# Patient Record
Sex: Female | Born: 1985 | Race: White | Hispanic: No | Marital: Married | Smoking: Never smoker
Health system: Southern US, Community
[De-identification: ages and names within clinical notes are randomized; demographics above are authoritative.]

## PROBLEM LIST (undated history)

## (undated) ENCOUNTER — Ambulatory Visit: Discharge status: Absent without leave | Payer: BLUE CROSS/BLUE SHIELD

## (undated) DIAGNOSIS — F419 Anxiety disorder, unspecified: Secondary | ICD-10-CM

## (undated) DIAGNOSIS — T7840XA Allergy, unspecified, initial encounter: Secondary | ICD-10-CM

## (undated) HISTORY — DX: Anxiety disorder, unspecified: F41.9

## (undated) HISTORY — PX: NO PAST SURGERIES: SHX2092

## (undated) HISTORY — DX: Allergy, unspecified, initial encounter: T78.40XA

---

## 2009-03-06 ENCOUNTER — Emergency Department (HOSPITAL_COMMUNITY): Admission: EM | Admit: 2009-03-06 | Discharge: 2009-03-06 | Payer: Self-pay | Admitting: Emergency Medicine

## 2013-02-16 ENCOUNTER — Other Ambulatory Visit (HOSPITAL_COMMUNITY): Payer: Self-pay | Admitting: Obstetrics and Gynecology

## 2013-02-16 DIAGNOSIS — IMO0002 Reserved for concepts with insufficient information to code with codable children: Secondary | ICD-10-CM

## 2013-02-23 ENCOUNTER — Ambulatory Visit (HOSPITAL_COMMUNITY)
Admission: RE | Admit: 2013-02-23 | Discharge: 2013-02-23 | Disposition: A | Discharge status: Home / Self Care | Payer: BC Managed Care – PPO | Source: Ambulatory Visit | Attending: Obstetrics and Gynecology | Admitting: Obstetrics and Gynecology

## 2013-02-23 DIAGNOSIS — Q51810 Arcuate uterus: Secondary | ICD-10-CM | POA: Insufficient documentation

## 2013-02-23 DIAGNOSIS — IMO0002 Reserved for concepts with insufficient information to code with codable children: Secondary | ICD-10-CM

## 2013-02-23 DIAGNOSIS — N979 Female infertility, unspecified: Secondary | ICD-10-CM | POA: Insufficient documentation

## 2013-02-23 MED ORDER — IOHEXOL 300 MG/ML  SOLN
20.0000 mL | Freq: Once | INTRAMUSCULAR | Status: AC | PRN
  Administered 2013-02-23: 7 mL

## 2013-11-08 ENCOUNTER — Ambulatory Visit (INDEPENDENT_AMBULATORY_CARE_PROVIDER_SITE_OTHER): Payer: BC Managed Care – PPO | Admitting: Physician Assistant

## 2013-11-08 ENCOUNTER — Encounter: Payer: Self-pay | Admitting: Physician Assistant

## 2013-11-08 VITALS — BP 126/76 | HR 106 | Temp 99.8°F | Resp 16 | Ht 70.75 in | Wt 227.0 lb

## 2013-11-08 DIAGNOSIS — R05 Cough: Secondary | ICD-10-CM

## 2013-11-08 DIAGNOSIS — F411 Generalized anxiety disorder: Secondary | ICD-10-CM | POA: Insufficient documentation

## 2013-11-08 DIAGNOSIS — R059 Cough, unspecified: Secondary | ICD-10-CM

## 2013-11-08 DIAGNOSIS — J302 Other seasonal allergic rhinitis: Secondary | ICD-10-CM | POA: Insufficient documentation

## 2013-11-08 MED ORDER — AZITHROMYCIN 250 MG PO TABS: ORAL_TABLET | ORAL | Status: DC

## 2013-11-08 MED ORDER — HYDROCOD POLST-CHLORPHEN POLST 10-8 MG/5ML PO LQCR
5.0000 mL | Freq: Two times a day (BID) | ORAL | Status: DC | PRN
Start: 2013-11-08 — End: 2015-05-30

## 2013-11-08 MED ORDER — BENZONATATE 100 MG PO CAPS: 100.0000 mg | ORAL_CAPSULE | Freq: Three times a day (TID) | ORAL | Status: DC | PRN

## 2013-11-08 NOTE — Progress Notes (Signed)
I have discussed this case with Ms. Brewington, PA-C and agree.  

## 2013-11-08 NOTE — Progress Notes (Signed)
   Subjective:    Patient ID: Sherry [name], female    DOB: 12/15/1985, 28 y.o.   MRN: [id number]  HPI Patient presents with 4 days of productive cough. Denies upper resp. sx., but endorses fever of 99.5 at home, fatigue, CP and vomiting secondary to cough. Coughs throughout day, but worse at night. 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 Has taken Delsym cough and cold dm with minimal relief. Has controlled seasonal allergies, but no h/o asthma.   Review of Systems  Constitutional: Positive for fever and fatigue. Negative for chills and appetite change.  HENT: Positive for sore throat (secondary to cough). Negative for congestion, ear discharge, ear pain, postnasal drip, rhinorrhea, sinus pressure and sneezing.   Eyes: Negative for pain.  Respiratory: Positive for cough (productive). Negative for chest tightness, shortness of breath and wheezing.   Cardiovascular: Positive for chest pain (secondary to cough).  Gastrointestinal: Positive for vomiting (secondary to cough). Negative for nausea, abdominal pain, diarrhea and constipation.  Musculoskeletal: Negative for myalgias, neck pain and neck stiffness.  Skin: Negative for pallor and rash.  Allergic/Immunologic: Negative for environmental allergies and food allergies.  Neurological: Negative for dizziness, light-headedness and headaches.  Hematological: Negative for adenopathy.       Objective:   Physical Exam  Constitutional: She is oriented to person, place, and time. She appears well-developed and well-nourished. No distress.  Blood pressure 126/76, pulse 106, temperature 99.8 F (37.7 C), resp. rate 16.   HENT:  Head: Normocephalic and atraumatic.  Right Ear: External ear normal.  Left Ear: External ear normal.  Mouth/Throat: Oropharynx is clear and moist. No  oropharyngeal exudate.  Eyes: Conjunctivae are normal. Pupils are equal, round, and reactive to light. Right eye exhibits no discharge. Left eye exhibits no discharge.  Neck: Normal range of motion. Neck supple.  Cardiovascular: Regular rhythm and normal heart sounds.  Tachycardia present.  Exam reveals no gallop and no friction rub.   No murmur heard. Pulmonary/Chest: Effort normal. No accessory muscle usage. No tachypnea. No respiratory distress. She has no wheezes. She has no rhonchi. She has no rales. She exhibits tenderness.  Breath sounds normal until patient coughs sounds wet   Abdominal: Soft. She exhibits no mass. There is no tenderness.  Lymphadenopathy:    She has no cervical adenopathy.  Neurological: She is alert and oriented to person, place, and time.  Skin: Skin is warm and dry. No rash noted. She is not diaphoretic. No erythema. No pallor.        Assessment & Plan:  1. Cough With hosptialized sick contact and fever, will treat more aggressively. - benzonatate (TESSALON) 100 MG capsule; Take 1-2 capsules (100-200 mg total) by mouth 3 (three) times daily as needed for cough.  Dispense: 40 capsule; Refill: 0 - chlorpheniramine-HYDROcodone (TUSSIONEX PENNKINETIC ER) 10-8 MG/5ML LQCR; Take 5 mLs by mouth every 12 (twelve) hours as needed for cough (cough).  Dispense: 60 mL; Refill: 0 - azithromycin (ZITHROMAX) 250 MG tablet; Take 2 tabs PO x 1 dose, then 1 tab PO QD x 4 days  Dispense: 6 tablet; Refill: 0 - Increase fluid intake and get plenty of rest. - Ibuprofen for fever. - RTC if fever increases to 101 degrees.  Janan Ridgeishira Edahi Kroening PA-C  [facility] 11/08/2013 1:08 PM

## 2014-04-09 ENCOUNTER — Other Ambulatory Visit: Payer: Self-pay | Admitting: Obstetrics & Gynecology

## 2014-04-19 ENCOUNTER — Encounter (HOSPITAL_COMMUNITY): Payer: Self-pay

## 2014-04-19 ENCOUNTER — Encounter (HOSPITAL_COMMUNITY)
Admission: RE | Admit: 2014-04-19 | Discharge: 2014-04-19 | Disposition: A | Discharge status: Home / Self Care | Payer: BLUE CROSS/BLUE SHIELD | Source: Ambulatory Visit | Attending: Obstetrics & Gynecology | Admitting: Obstetrics & Gynecology

## 2014-04-19 DIAGNOSIS — N8329 Other ovarian cysts: Secondary | ICD-10-CM | POA: Diagnosis not present

## 2014-04-19 DIAGNOSIS — N971 Female infertility of tubal origin: Secondary | ICD-10-CM | POA: Diagnosis not present

## 2014-04-19 DIAGNOSIS — F419 Anxiety disorder, unspecified: Secondary | ICD-10-CM | POA: Diagnosis not present

## 2014-04-19 LAB — CBC
HEMATOCRIT: 42.2 % (ref 36.0–46.0)
Hemoglobin: 14.1 g/dL (ref 12.0–15.0)
MCH: 29.6 pg (ref 26.0–34.0)
MCHC: 33.4 g/dL (ref 30.0–36.0)
MCV: 88.7 fL (ref 78.0–100.0)
Platelets: 195 10*3/uL (ref 150–400)
RBC: 4.76 MIL/uL (ref 3.87–5.11)
RDW: 13.7 % (ref 11.5–15.5)
WBC: 6.8 10*3/uL (ref 4.0–10.5)

## 2014-04-19 LAB — TYPE AND SCREEN
ABO/RH(D): O POS
ANTIBODY SCREEN: NEGATIVE

## 2014-04-19 LAB — ABO/RH: ABO/RH(D): O POS

## 2014-04-19 NOTE — Patient Instructions (Signed)
Your procedure is scheduled on:04/22/14  Enter through the Main Entrance at :6am  Pick up desk phone and dial [phone] and inform us of your arrival.  Please call [phone] if you have any problems the morning of surgery.  Remember: Do not eat food or drink liquids, including water, after midnight WED   You may brush your teeth the morning of surgery.    DO NOT wear jewelry, eye make-up, lipstick,body lotion, or dark fingernail polish.  (Polished toes are ok) You may wear deodorant.  If you are to be admitted after surgery, leave suitcase in car until your room has been assigned. Patients discharged on the day of surgery will not be allowed to drive home. Wear loose fitting, comfortable clothes for your ride home.

## 2014-04-21 ENCOUNTER — Encounter (HOSPITAL_COMMUNITY): Payer: Self-pay | Admitting: Anesthesiology

## 2014-04-21 NOTE — Anesthesia Preprocedure Evaluation (Addendum)
Anesthesia Evaluation  Patient identified by MRN, date of birth, ID band Patient awake    Reviewed: Allergy & Precautions, NPO status , Patient's Chart, lab work & pertinent test results  Airway Mallampati: I  TM Distance: >3 FB Neck ROM: Full    Dental no notable dental hx. (+) Teeth Intact   Pulmonary neg pulmonary ROS,  breath sounds clear to auscultation  Pulmonary exam normal       Cardiovascular negative cardio ROS  Rhythm:Regular Rate:Normal     Neuro/Psych Anxiety negative neurological ROS     GI/Hepatic negative GI ROS, Neg liver ROS,   Endo/Other  Complex left ovarian cyst  Renal/GU negative Renal ROS  negative genitourinary   Musculoskeletal negative musculoskeletal ROS (+)   Abdominal   Peds  Hematology negative hematology ROS (+)   Anesthesia Other Findings   Reproductive/Obstetrics negative OB ROS                            Anesthesia Physical Anesthesia Plan  ASA: II  Anesthesia Plan: General   Post-op Pain Management:    Induction: Intravenous  Airway Management Planned: Oral ETT  Additional Equipment:   Intra-op Plan:   Post-operative Plan: Extubation in OR  Informed Consent: I have reviewed the patients History and Physical, chart, labs and discussed the procedure including the risks, benefits and alternatives for the proposed anesthesia with the patient or authorized representative who has indicated his/her understanding and acceptance.   Dental advisory given  Plan Discussed with: CRNA, Anesthesiologist and Surgeon  Anesthesia Plan Comments:         Anesthesia Quick Evaluation

## 2014-04-22 ENCOUNTER — Ambulatory Visit (HOSPITAL_COMMUNITY): Payer: BLUE CROSS/BLUE SHIELD | Admitting: Anesthesiology

## 2014-04-22 ENCOUNTER — Encounter (HOSPITAL_COMMUNITY)
Admission: RE | Disposition: A | Discharge status: Home / Self Care | Payer: Self-pay | Source: Ambulatory Visit | Attending: Obstetrics & Gynecology

## 2014-04-22 ENCOUNTER — Ambulatory Visit (HOSPITAL_COMMUNITY)
Admission: RE | Admit: 2014-04-22 | Discharge: 2014-04-22 | Disposition: A | Discharge status: Home / Self Care | Payer: BLUE CROSS/BLUE SHIELD | Source: Ambulatory Visit | Attending: Obstetrics & Gynecology | Admitting: Obstetrics & Gynecology

## 2014-04-22 DIAGNOSIS — N971 Female infertility of tubal origin: Secondary | ICD-10-CM | POA: Insufficient documentation

## 2014-04-22 DIAGNOSIS — N8329 Other ovarian cysts: Secondary | ICD-10-CM | POA: Diagnosis not present

## 2014-04-22 DIAGNOSIS — F419 Anxiety disorder, unspecified: Secondary | ICD-10-CM | POA: Insufficient documentation

## 2014-04-22 HISTORY — PX: ROBOTIC ASSISTED LAPAROSCOPIC OVARIAN CYSTECTOMY: SHX6081

## 2014-04-22 HISTORY — PX: CHROMOPERTUBATION: SHX6288

## 2014-04-22 LAB — PREGNANCY, URINE: Preg Test, Ur: NEGATIVE

## 2014-04-22 SURGERY — ROBOTIC ASSISTED LAPAROSCOPIC OVARIAN CYSTECTOMY
Anesthesia: General | Site: Uterus

## 2014-04-22 MED ORDER — MIDAZOLAM HCL 2 MG/2ML IJ SOLN
INTRAMUSCULAR | Status: AC
  Filled 2014-04-22: qty 2

## 2014-04-22 MED ORDER — ROCURONIUM BROMIDE 100 MG/10ML IV SOLN
INTRAVENOUS | Status: DC | PRN
  Administered 2014-04-22 (×2): 10 mg via INTRAVENOUS
  Administered 2014-04-22: 35 mg via INTRAVENOUS
  Administered 2014-04-22: 5 mg via INTRAVENOUS

## 2014-04-22 MED ORDER — ONDANSETRON HCL 4 MG/2ML IJ SOLN
INTRAMUSCULAR | Status: AC
  Filled 2014-04-22: qty 2

## 2014-04-22 MED ORDER — ONDANSETRON HCL 4 MG/2ML IJ SOLN
INTRAMUSCULAR | Status: DC | PRN
  Administered 2014-04-22: 4 mg via INTRAVENOUS

## 2014-04-22 MED ORDER — BUPIVACAINE HCL (PF) 0.25 % IJ SOLN
INTRAMUSCULAR | Status: AC
  Filled 2014-04-22: qty 30

## 2014-04-22 MED ORDER — GLYCOPYRROLATE 0.2 MG/ML IJ SOLN
INTRAMUSCULAR | Status: DC | PRN
  Administered 2014-04-22: .9 mg via INTRAVENOUS
  Administered 2014-04-22: 0.1 mg via INTRAVENOUS

## 2014-04-22 MED ORDER — LIDOCAINE HCL (CARDIAC) 20 MG/ML IV SOLN
INTRAVENOUS | Status: DC | PRN
Start: 2014-04-22 — End: 2014-04-22
  Administered 2014-04-22: 100 mg via INTRAVENOUS

## 2014-04-22 MED ORDER — LIDOCAINE HCL (CARDIAC) 20 MG/ML IV SOLN
INTRAVENOUS | Status: AC
  Filled 2014-04-22: qty 5

## 2014-04-22 MED ORDER — DEXAMETHASONE SODIUM PHOSPHATE 4 MG/ML IJ SOLN
INTRAMUSCULAR | Status: DC | PRN
  Administered 2014-04-22: 4 mg via INTRAVENOUS

## 2014-04-22 MED ORDER — OXYCODONE-ACETAMINOPHEN 7.5-325 MG PO TABS: 1.0000 | ORAL_TABLET | ORAL | Status: DC | PRN

## 2014-04-22 MED ORDER — METHYLENE BLUE 1 % INJ SOLN
INTRAMUSCULAR | Status: AC
  Filled 2014-04-22: qty 1

## 2014-04-22 MED ORDER — NEOSTIGMINE METHYLSULFATE 10 MG/10ML IV SOLN
INTRAVENOUS | Status: DC | PRN
  Administered 2014-04-22: 5 mg via INTRAVENOUS

## 2014-04-22 MED ORDER — SCOPOLAMINE 1 MG/3DAYS TD PT72
MEDICATED_PATCH | TRANSDERMAL | Status: AC
  Administered 2014-04-22: 1.5 mg via TRANSDERMAL
  Filled 2014-04-22: qty 1

## 2014-04-22 MED ORDER — BUPIVACAINE HCL (PF) 0.25 % IJ SOLN
INTRAMUSCULAR | Status: DC | PRN
  Administered 2014-04-22: 20 mL

## 2014-04-22 MED ORDER — HEPARIN SODIUM (PORCINE) 5000 UNIT/ML IJ SOLN
INTRAMUSCULAR | Status: AC
  Filled 2014-04-22: qty 1

## 2014-04-22 MED ORDER — LACTATED RINGERS IV SOLN
INTRAVENOUS | Status: DC
  Administered 2014-04-22 (×2): via INTRAVENOUS

## 2014-04-22 MED ORDER — FENTANYL CITRATE 0.05 MG/ML IJ SOLN
25.0000 ug | INTRAMUSCULAR | Status: DC | PRN
  Administered 2014-04-22: 50 ug via INTRAVENOUS

## 2014-04-22 MED ORDER — DEXAMETHASONE SODIUM PHOSPHATE 10 MG/ML IJ SOLN
INTRAMUSCULAR | Status: AC
  Filled 2014-04-22: qty 1

## 2014-04-22 MED ORDER — METHYLENE BLUE 1 % INJ SOLN
INTRAMUSCULAR | Status: DC | PRN
  Administered 2014-04-22: 1 mL

## 2014-04-22 MED ORDER — FENTANYL CITRATE 0.05 MG/ML IJ SOLN
INTRAMUSCULAR | Status: AC
  Filled 2014-04-22: qty 2

## 2014-04-22 MED ORDER — GLYCOPYRROLATE 0.2 MG/ML IJ SOLN
INTRAMUSCULAR | Status: AC
  Filled 2014-04-22: qty 1

## 2014-04-22 MED ORDER — NEOSTIGMINE METHYLSULFATE 10 MG/10ML IV SOLN
INTRAVENOUS | Status: AC
  Filled 2014-04-22: qty 1

## 2014-04-22 MED ORDER — VASOPRESSIN 20 UNIT/ML IV SOLN
INTRAVENOUS | Status: AC
  Filled 2014-04-22: qty 1

## 2014-04-22 MED ORDER — PROPOFOL 10 MG/ML IV BOLUS
INTRAVENOUS | Status: AC
  Filled 2014-04-22: qty 20

## 2014-04-22 MED ORDER — ROPIVACAINE HCL 5 MG/ML IJ SOLN
INTRAMUSCULAR | Status: AC
  Filled 2014-04-22: qty 60

## 2014-04-22 MED ORDER — LACTATED RINGERS IR SOLN
Status: DC | PRN
  Administered 2014-04-22: 500 mL

## 2014-04-22 MED ORDER — STERILE WATER FOR IRRIGATION IR SOLN
Status: DC | PRN
  Administered 2014-04-22: 3000 mL

## 2014-04-22 MED ORDER — KETOROLAC TROMETHAMINE 30 MG/ML IJ SOLN
INTRAMUSCULAR | Status: DC | PRN
  Administered 2014-04-22: 30 mg via INTRAVENOUS

## 2014-04-22 MED ORDER — CEFAZOLIN SODIUM-DEXTROSE 2-3 GM-% IV SOLR
INTRAVENOUS | Status: AC
  Filled 2014-04-22: qty 50

## 2014-04-22 MED ORDER — HYDROMORPHONE HCL 1 MG/ML IJ SOLN
INTRAMUSCULAR | Status: DC | PRN
Start: 2014-04-22 — End: 2014-04-22
  Administered 2014-04-22: 1 mg via INTRAVENOUS

## 2014-04-22 MED ORDER — MIDAZOLAM HCL 2 MG/2ML IJ SOLN
INTRAMUSCULAR | Status: DC | PRN
  Administered 2014-04-22: 2 mg via INTRAVENOUS

## 2014-04-22 MED ORDER — SCOPOLAMINE 1 MG/3DAYS TD PT72
1.0000 | MEDICATED_PATCH | Freq: Once | TRANSDERMAL | Status: DC
  Administered 2014-04-22: 1.5 mg via TRANSDERMAL

## 2014-04-22 MED ORDER — MEPERIDINE HCL 25 MG/ML IJ SOLN: 6.2500 mg | INTRAMUSCULAR | Status: DC | PRN

## 2014-04-22 MED ORDER — FENTANYL CITRATE 0.05 MG/ML IJ SOLN
INTRAMUSCULAR | Status: AC
  Filled 2014-04-22: qty 5

## 2014-04-22 MED ORDER — PROPOFOL 10 MG/ML IV BOLUS
INTRAVENOUS | Status: DC | PRN
  Administered 2014-04-22: 200 mg via INTRAVENOUS

## 2014-04-22 MED ORDER — AMMONIA AROMATIC IN INHA
RESPIRATORY_TRACT | Status: AC
  Filled 2014-04-22: qty 10

## 2014-04-22 MED ORDER — ARTIFICIAL TEARS OP OINT
TOPICAL_OINTMENT | OPHTHALMIC | Status: DC | PRN
  Administered 2014-04-22: 1 via OPHTHALMIC

## 2014-04-22 MED ORDER — FENTANYL CITRATE 0.05 MG/ML IJ SOLN
INTRAMUSCULAR | Status: DC | PRN
  Administered 2014-04-22: 100 ug via INTRAVENOUS
  Administered 2014-04-22: 50 ug via INTRAVENOUS
  Administered 2014-04-22: 100 ug via INTRAVENOUS

## 2014-04-22 MED ORDER — DEXAMETHASONE SODIUM PHOSPHATE 4 MG/ML IJ SOLN
INTRAMUSCULAR | Status: AC
Start: 2014-04-22 — End: 2014-04-22
  Filled 2014-04-22: qty 1

## 2014-04-22 MED ORDER — SODIUM CHLORIDE 0.9 % IJ SOLN
INTRAMUSCULAR | Status: AC
  Filled 2014-04-22: qty 100

## 2014-04-22 MED ORDER — GLYCOPYRROLATE 0.2 MG/ML IJ SOLN
INTRAMUSCULAR | Status: AC
  Filled 2014-04-22: qty 4

## 2014-04-22 MED ORDER — CEFAZOLIN SODIUM-DEXTROSE 2-3 GM-% IV SOLR
2.0000 g | INTRAVENOUS | Status: AC
  Administered 2014-04-22: 2 g via INTRAVENOUS

## 2014-04-22 MED ORDER — METOCLOPRAMIDE HCL 5 MG/ML IJ SOLN: 10.0000 mg | Freq: Once | INTRAMUSCULAR | Status: DC | PRN

## 2014-04-22 MED ORDER — HYDROMORPHONE HCL 1 MG/ML IJ SOLN
INTRAMUSCULAR | Status: AC
  Filled 2014-04-22: qty 1

## 2014-04-22 SURGICAL SUPPLY — 62 items
BARRIER ADHS 3X4 INTERCEED (GAUZE/BANDAGES/DRESSINGS) IMPLANT
CHLORAPREP W/TINT 26ML (MISCELLANEOUS) ×4 IMPLANT
CLOTH BEACON ORANGE TIMEOUT ST (SAFETY) ×4 IMPLANT
CONT PATH 16OZ SNAP LID 3702 (MISCELLANEOUS) ×4 IMPLANT
COVER BACK TABLE 60X90IN (DRAPES) ×8 IMPLANT
COVER TIP SHEARS 8 DVNC (MISCELLANEOUS) ×2 IMPLANT
COVER TIP SHEARS 8MM DA VINCI (MISCELLANEOUS) ×2
DECANTER SPIKE VIAL GLASS SM (MISCELLANEOUS) ×8 IMPLANT
DRAPE WARM FLUID 44X44 (DRAPE) ×4 IMPLANT
DRSG COVADERM PLUS 2X2 (GAUZE/BANDAGES/DRESSINGS) IMPLANT
DRSG OPSITE POSTOP 3X4 (GAUZE/BANDAGES/DRESSINGS) ×4 IMPLANT
ELECT REM PT RETURN 9FT ADLT (ELECTROSURGICAL) ×4
ELECTRODE REM PT RTRN 9FT ADLT (ELECTROSURGICAL) ×2 IMPLANT
GAUZE VASELINE 3X9 (GAUZE/BANDAGES/DRESSINGS) ×4 IMPLANT
GLOVE BIO SURGEON STRL SZ 6.5 (GLOVE) ×15 IMPLANT
GLOVE BIO SURGEONS STRL SZ 6.5 (GLOVE) ×5
GLOVE BIOGEL PI IND STRL 6.5 (GLOVE) ×8 IMPLANT
GLOVE BIOGEL PI IND STRL 7.0 (GLOVE) ×16 IMPLANT
GLOVE BIOGEL PI INDICATOR 6.5 (GLOVE) ×8
GLOVE BIOGEL PI INDICATOR 7.0 (GLOVE) ×16
GLOVE ECLIPSE 7.0 STRL STRAW (GLOVE) ×12 IMPLANT
IV STOPCOCK 4 WAY 40  W/Y SET (IV SOLUTION) ×2
IV STOPCOCK 4 WAY 40 W/Y SET (IV SOLUTION) ×2 IMPLANT
KIT ACCESSORY DA VINCI DISP (KITS) ×2
KIT ACCESSORY DVNC DISP (KITS) ×2 IMPLANT
LIQUID BAND (GAUZE/BANDAGES/DRESSINGS) ×4 IMPLANT
NEEDLE HYPO 22GX1.5 SAFETY (NEEDLE) ×4 IMPLANT
OCCLUDER COLPOPNEUMO (BALLOONS) ×4 IMPLANT
PACK ROBOT WH (CUSTOM PROCEDURE TRAY) ×4 IMPLANT
PACK ROBOTIC GOWN (GOWN DISPOSABLE) ×4 IMPLANT
PAD POSITIONER PINK NONSTERILE (MISCELLANEOUS) ×4 IMPLANT
PAD PREP 24X48 CUFFED NSTRL (MISCELLANEOUS) ×8 IMPLANT
SET CYSTO W/LG BORE CLAMP LF (SET/KITS/TRAYS/PACK) IMPLANT
SET IRRIG TUBING LAPAROSCOPIC (IRRIGATION / IRRIGATOR) ×4 IMPLANT
SET TRI-LUMEN FLTR TB AIRSEAL (TUBING) ×4 IMPLANT
SUT VIC AB 0 CT1 27 (SUTURE) ×4
SUT VIC AB 0 CT1 27XBRD ANTBC (SUTURE) ×4 IMPLANT
SUT VIC AB 0 CT2 27 (SUTURE) IMPLANT
SUT VIC AB 2-0 CT1 27 (SUTURE)
SUT VIC AB 2-0 CT1 TAPERPNT 27 (SUTURE) IMPLANT
SUT VIC AB 3-0 SH 27 (SUTURE)
SUT VIC AB 3-0 SH 27X BRD (SUTURE) IMPLANT
SUT VIC AB 4-0 PS2 27 (SUTURE) ×8 IMPLANT
SUT VICRYL 0 UR6 27IN ABS (SUTURE) ×8 IMPLANT
SYR 50ML LL SCALE MARK (SYRINGE) ×4 IMPLANT
SYR TB 1ML 25GX5/8 (SYRINGE) ×4 IMPLANT
SYSTEM CONVERTIBLE TROCAR (TROCAR) ×4 IMPLANT
TIP UTERINE 5.1X6CM LAV DISP (MISCELLANEOUS) IMPLANT
TIP UTERINE 6.7X10CM GRN DISP (MISCELLANEOUS) IMPLANT
TIP UTERINE 6.7X6CM WHT DISP (MISCELLANEOUS) IMPLANT
TIP UTERINE 6.7X8CM BLUE DISP (MISCELLANEOUS) ×4 IMPLANT
TOWEL OR 17X24 6PK STRL BLUE (TOWEL DISPOSABLE) ×12 IMPLANT
TRAY FOLEY BAG SILVER LF 16FR (SET/KITS/TRAYS/PACK) ×4 IMPLANT
TROCAR 12M 150ML BLUNT (TROCAR) ×4 IMPLANT
TROCAR DISP BLADELESS 8 DVNC (TROCAR) ×2 IMPLANT
TROCAR DISP BLADELESS 8MM (TROCAR) ×2
TROCAR OPTI TIP 12M 100M (ENDOMECHANICALS) IMPLANT
TROCAR PORT AIRSEAL 5X120 (TROCAR) ×4 IMPLANT
TROCAR XCEL 12X100 BLDLESS (ENDOMECHANICALS) IMPLANT
TROCAR XCEL NON-BLD 5MMX100MML (ENDOMECHANICALS) IMPLANT
WARMER LAPAROSCOPE (MISCELLANEOUS) ×4 IMPLANT
WATER STERILE IRR 1000ML POUR (IV SOLUTION) ×12 IMPLANT

## 2014-04-22 NOTE — Anesthesia Procedure Notes (Signed)
Procedure Name: Intubation Date/Time: 04/22/2014 7:36 AM Performed by: Graciela HusbandsFUSSELL, Christoher Drudge O Pre-anesthesia Checklist: Patient identified, Timeout performed, Emergency Drugs available, Suction available and Patient being monitored Patient Re-evaluated:Patient Re-evaluated prior to inductionOxygen Delivery Method: Circle system utilized Preoxygenation: Pre-oxygenation with 100% oxygen Intubation Type: IV induction Ventilation: Mask ventilation without difficulty Tube type: Oral Tube size: 7.0 mm Number of attempts: 2 Airway Equipment and Method: Stylet (positioned with 5  folded bath blankets under upper back & shoulders for sniffing position.) Placement Confirmation: ETT inserted through vocal cords under direct vision,  positive ETCO2 and breath sounds checked- equal and bilateral Secured at: 21 cm Tube secured with: Tape Dental Injury: Teeth and Oropharynx as per pre-operative assessment

## 2014-04-22 NOTE — Discharge Instructions (Signed)
Ovarian Cystectomy, Care After  Refer to this sheet in the next few weeks. These instructions provide you with information on caring for yourself after your procedure. Your health care provider may also give you more specific instructions. Your treatment has been planned according to current medical practices, but problems sometimes occur. Call your health care provider if you have any problems or questions after your procedure.   WHAT TO EXPECT AFTER THE PROCEDURE  After your procedure, it is typical to have the following:  · Pain in your abdomen, especially at the incision sites. You will be given pain medicines to control the pain.  · Tiredness. This is a normal part of the recovery process. Your energy level will return to normal over the next several weeks.  · Constipation.  HOME CARE INSTRUCTIONS  · Only take over-the-counter or prescription medicines as directed by your health care provider. Avoid taking aspirin because it can cause bleeding.    · Follow your health care provider's instructions for when to resume your regular diet, exercise, and activities.    · Take rest breaks during the day as needed.  · Do not douche or have sexual intercourse until you have permission from your health care provider.    · Remove or change any bandages (dressings) as directed by your health care provider.    · Do not drive until your health care provider approves.    · Take showers instead of baths until your health care provider tells you otherwise.    · If you become constipated, you may:    ¨ Use a mild laxative if your health care provider approves.  ¨ Add more fruit and bran to your diet.  ¨ Drink more fluids.  · Take your temperature twice a day and record it.    · Do not drink alcohol while taking pain medicine.    · Try to have someone home with you for the first 1-2 weeks to help with your household activities.    · Follow up with your health care provider as directed.  SEEK MEDICAL CARE IF:  · You have a fever.     · You feel sick to your stomach (nauseous) and throw up (vomit).    · You have redness, swelling, or leakage of fluid at the incision site.    · You have pain when you urinate or have blood in your urine.    · You have a rash on your body.    · You have pain or redness where the IV tube was inserted.    · You have pain that is not relieved with medicine.    SEEK IMMEDIATE MEDICAL CARE IF:  · You have chest pain or shortness of breath.    · You feel dizzy or lightheaded.    · You have increasing abdominal pain that is not relieved with medicines.    · You have pain, swelling, or redness in your leg.    · You see a yellowish white fluid (pus) coming from the incision.    · Your incision is opening (edges not staying together).    Document Released: 10/15/2012 Document Reviewed: 10/15/2012  ExitCare® Patient Information ©2015 [employer]. This information is not intended to replace advice given to you by your health care provider. Make sure you discuss any questions you have with your health care provider.

## 2014-04-22 NOTE — Anesthesia Postprocedure Evaluation (Signed)
  Anesthesia Post-op Note  Patient: Sherry [name]  Procedure(s) Performed: Procedure(s) with comments: ROBOTIC ASSISTED LAPAROSCOPIC OVARIAN CYSTECTOMY (Left) CHROMOPERTUBATION (N/A) - Fallopian Tubes  Patient Location: PACU  Anesthesia Type:General  Level of Consciousness: awake, alert  and oriented  Airway and Oxygen Therapy: Patient Spontanous Breathing  Post-op Pain: none  Post-op Assessment: Post-op Vital signs reviewed, Patient's Cardiovascular Status Stable, Respiratory Function Stable, Patent Airway, No signs of Nausea or vomiting and Pain level controlled  Post-op Vital Signs: Reviewed and stable  Last Vitals:  Filed Vitals:   04/22/14 1045  BP: 115/65  Pulse: 64  Temp:   Resp: 14    Complications: No apparent anesthesia complications

## 2014-04-22 NOTE — Op Note (Signed)
04/22/2014  9:35 AM  PATIENT:  Sherry [name]  29 y.o. female  PRE-OPERATIVE DIAGNOSIS:  Left Complex Ovarian Cyst  POST-OPERATIVE DIAGNOSIS:  Left Complex Ovarian Cyst, Left proximal tubal obstruction  PROCEDURE:  Procedure(s): ROBOTIC ASSISTED LAPAROSCOPIC OVARIAN CYSTECTOMY AND LEFT OVARIAN BIOPSY, CHROMOPERTUBATION  SURGEON:  Surgeon(s): Genia DelMarie-Lyne Laureano Hetzer, MD Noland FordyceKelly Fogleman, MD  ASSISTANTS: Dr Noland FordyceKelly Fogleman   ANESTHESIA:   general   PROCEDURE:  Under general anesthesia with endotracheal intubation the patient is an lithotomy position. She is prepped with Betadine on the suprapubic vulvar and vaginal areas. And with ChloraPrep on the abdomen.  She is draped as usual. A Foley is inserted in the bladder. A #8 Rumi with a small Koh ring are put in place easily.  Abdominally we make an infiltration of Marcaine one quarter plain at the supraumbilical area. We make a 1.5 incision with a scalpel at that level. The aponeurosis is opened under direct vision with Mayo scissors and the parietal peritoneum is opened under direct vision with Mayo scissors as well.  A pursestring stitch of Vicryl 0 is done at the aponeurosis. The Sherry [name] is inserted at that level under direct vision anatomic pneumoperitoneum is created with CO2. The camera is inserted. Inspection of the abdominopelvic cavities reveal a normal uterus normal right tube and normal right ovary. The left ovary presents a 5-6 cm cyst with some small solid components at the surface but no increased vascularity and a very thin wall.  The left tube was completely adherent to that cyst.  The cyst is originating from the tip of the left ovary but is not distending the left ovary.  The gallbladder and liver are normal to inspection.  No other pathology is seen.  We use a semicircular configuration for port placement. With 1 robotic port on the right side, one robotic port on the left lower side and the assistant port at the upper left side. The  robot is docked on the right side of the patient.  The patient is in deep Trendelenburg but not maximal.  The EndoShears scissor is inserted in the right robotic port and the PK on the left under direct vision.  Will go to the console.  We start with peritoneal washings.  We then proceeded with Methylene blue chromopertubation.  Confirmation of right tubal patency is easily done.  The left tube does not allow any Methylene  blue even after occluding the right side and changing the angles.  We then proceeded with left ovarian cystectomy, detaching the left tube from the cyst carefully.  We take a left ovarian biopsy where the cyst attaches at the tip of the left ovary.  We also note a solid part inside the cyst which measures about 1 cm and is compatible with the pelvic ultrasound findings.  Note that the cyst ruptured during the cystectomy and a very clear fluid was seen.  The specimen is put in the posterior cul-de-sac.  We irrigate and suction the abdominal pelvic cavities.  Hemostasis is adequate at all levels.  The Methylene blue chromopertubation is done once more and again the same findings are shown.  The robotic instruments are therefore removed. The robot is undocked.  The 8 mm camera is used and the Endobag was inserted through the supraumbilical port.  The specimen is put in the Endobag and removed from the abdominopelvic cavity.  The specimen is sent to pathology.  We irrigate and suction the abdominopelvic cavity again.  We confirmed hemostasis.  All laparoscopic  instruments are removed.  The CO2 was evacuated.  The supraumbilical incision is closed by attaching the pursestring stitch on the aponeurosis.  All incisions are closed with a subcuticular stitch of Vicryl 4-0. Dermabond was added on all incisions.  Hemostasis is adequate at all levels.  The vaginal instruments are removed.  The Foley is removed from the bladder.  The patient is brought to recovery room in good and stable status.  ESTIMATED  BLOOD LOSS:  25 cc   Intake/Output Summary (Last 24 hours) at 04/22/14 0935 Last data filed at 04/22/14 1191  Gross per 24 hour  Intake   1000 ml  Output    150 ml  Net    850 ml     BLOOD ADMINISTERED:none   LOCAL MEDICATIONS USED:  MARCAINE     SPECIMEN:  Source of Specimen:  Left ovarian cyst and left ovarian biopsy.  Peritoneal washings done (but contaminated with chemical, not sent)  DISPOSITION OF SPECIMEN:  PATHOLOGY  COUNTS:  YES  PLAN OF CARE: Transfer to PACU  Genia Del  MD  04/22/2014 at 9:36 am

## 2014-04-22 NOTE — Discharge Summary (Signed)
  Physician Discharge Summary  Patient ID: Sherry [name] MRN: [id number] DOB/AGE: 31/05/1985 30 y.o.  Admit date: 04/22/2014 Discharge date: 04/22/2014  Admission Diagnoses: Left Complex Ovarian Cyst  Discharge Diagnoses: Left Complex Ovarian Cyst, Left proximal tubal obstruction        Active Problems:   * No active hospital problems. *   Discharged Condition: good  Hospital Course: Outpatient  Consults: None  Treatments: surgery: Robotic left ovarian cystectomy and left ovarian biopsy.  Methylene Blue Chromopertubation  Disposition: Final discharge disposition not confirmed     Medication List    TAKE these medications        ALPRAZolam 0.25 MG tablet  Commonly known as:  XANAX  Take 0.25 mg by mouth as needed for anxiety.     benzonatate 100 MG capsule  Commonly known as:  TESSALON  Take 1-2 capsules (100-200 mg total) by mouth 3 (three) times daily as needed for cough.     cetirizine 10 MG tablet  Commonly known as:  ZYRTEC  Take 10 mg by mouth daily as needed for allergies.     chlorpheniramine-HYDROcodone 10-8 MG/5ML Lqcr  Commonly known as:  TUSSIONEX PENNKINETIC ER  Take 5 mLs by mouth every 12 (twelve) hours as needed for cough (cough).     oxyCODONE-acetaminophen 7.5-325 MG per tablet  Commonly known as:  PERCOCET  Take 1 tablet by mouth every 4 (four) hours as needed for severe pain.      ASK your doctor about these medications        azithromycin 250 MG tablet  Commonly known as:  ZITHROMAX  Take 2 tabs PO x 1 dose, then 1 tab PO QD x 4 days           Follow-up Information    Follow up with Myer Bohlman,MARIE-LYNE, MD In 3 weeks.   Specialty:  Obstetrics and Gynecology   Contact information:   [address] [place] [id number] [phone]       Signed: Genia DelLAVOIE,MARIE-LYNE, MD 04/22/2014, 9:58 AM

## 2014-04-22 NOTE — H&P (Signed)
Sherry KollerJessica C [name] is an 29 y.o. female 42P0A2  RP:  Lt complex ovarian cyst  Pertinent Gynecological History: Menses: flow is moderate Contraception: none (Infertility) Blood transfusions: none Sexually transmitted diseases: no past history Last mammogram: None Last pap: normal OB History: 2 miscarriages    Menstrual History:  No LMP recorded.    Past Medical History  Diagnosis Date  . Allergy   . Anxiety     Past Surgical History  Procedure Laterality Date  . No past surgeries      History reviewed. No pertinent family history.  Social History:  reports that she has never smoked. She has never used smokeless tobacco. She reports that she drinks about 1.2 - 1.8 oz of alcohol per week. She reports that she does not use illicit drugs.  Allergies: No Known Allergies  Prescriptions prior to admission  Medication Sig Dispense Refill Last Dose  . cetirizine (ZYRTEC) 10 MG tablet Take 10 mg by mouth daily as needed for allergies.     . ALPRAZolam (XANAX) 0.25 MG tablet Take 0.25 mg by mouth as needed for anxiety.   Taking  . azithromycin (ZITHROMAX) 250 MG tablet Take 2 tabs PO x 1 dose, then 1 tab PO QD x 4 days (Patient not taking: Reported on 04/08/2014) 6 tablet 0 Completed Course at Unknown time  . benzonatate (TESSALON) 100 MG capsule Take 1-2 capsules (100-200 mg total) by mouth 3 (three) times daily as needed for cough. (Patient not taking: Reported on 04/08/2014) 40 capsule 0   . chlorpheniramine-HYDROcodone (TUSSIONEX PENNKINETIC ER) 10-8 MG/5ML LQCR Take 5 mLs by mouth every 12 (twelve) hours as needed for cough (cough). (Patient not taking: Reported on 04/08/2014) 60 mL 0     ROS  Blood pressure 128/88, pulse 79, temperature 98.2 F (36.8 C), temperature source Oral, resp. rate 20, SpO2 100 %. Physical Exam  Pelvic US  5.1 cm Lt ovarian cyst with 1 cm echogenic nodule.  Results for orders placed or performed during the hospital encounter of 04/22/14 (from the  past 24 hour(s))  Pregnancy, urine     Status: None   Collection Time: 04/22/14  6:00 AM  Result Value Ref Range   Preg Test, Ur NEGATIVE NEGATIVE    No results found.  Assessment/Plan: Lt complex ovarian cyst for Robotic Left Ovarian Cystectomy.  Surgery and risks reviewed.  Consent signed.  Sherry [name],MARIE-LYNE 04/22/2014, 7:19 AM

## 2014-04-22 NOTE — Transfer of Care (Signed)
Immediate Anesthesia Transfer of Care Note  Patient: [name]  Procedure(s) Performed: Procedure(s) with comments: ROBOTIC ASSISTED LAPAROSCOPIC OVARIAN CYSTECTOMY (Left) CHROMOPERTUBATION (N/A) - Fallopian Tubes  Patient Location: PACU  Anesthesia Type:General  Level of Consciousness: awake, alert  and oriented  Airway & Oxygen Therapy: Patient Spontanous Breathing and Patient connected to nasal cannula oxygen  Post-op Assessment: Report given to RN and Post -op Vital signs reviewed and stable  Post vital signs: Reviewed and stable  Last Vitals:  Filed Vitals:   04/22/14 0610  BP: 128/88  Pulse: 79  Temp: 36.8 C  Resp: 20    Complications: No apparent anesthesia complications

## 2014-04-23 ENCOUNTER — Encounter (HOSPITAL_COMMUNITY): Payer: Self-pay | Admitting: Obstetrics & Gynecology

## 2014-04-23 MED FILL — Heparin Sodium (Porcine) Inj 5000 Unit/ML: INTRAMUSCULAR | Qty: 1 | Status: AC

## 2014-04-26 ENCOUNTER — Encounter: Payer: Self-pay | Admitting: Physician Assistant

## 2014-11-01 LAB — OB RESULTS CONSOLE RUBELLA ANTIBODY, IGM: Rubella: IMMUNE

## 2014-11-01 LAB — OB RESULTS CONSOLE ANTIBODY SCREEN: ANTIBODY SCREEN: NEGATIVE

## 2014-11-01 LAB — OB RESULTS CONSOLE ABO/RH: RH Type: POSITIVE

## 2014-11-01 LAB — OB RESULTS CONSOLE RPR: RPR: NONREACTIVE

## 2014-11-01 LAB — OB RESULTS CONSOLE HIV ANTIBODY (ROUTINE TESTING): HIV: NONREACTIVE

## 2014-11-01 LAB — OB RESULTS CONSOLE HEPATITIS B SURFACE ANTIGEN: Hepatitis B Surface Ag: NEGATIVE

## 2014-11-11 LAB — OB RESULTS CONSOLE GC/CHLAMYDIA
Chlamydia: NEGATIVE
GC PROBE AMP, GENITAL: NEGATIVE

## 2015-01-04 ENCOUNTER — Other Ambulatory Visit: Payer: Self-pay | Admitting: Family Medicine

## 2015-01-04 ENCOUNTER — Ambulatory Visit
Admission: RE | Admit: 2015-01-04 | Discharge: 2015-01-04 | Disposition: A | Discharge status: Home / Self Care | Payer: BLUE CROSS/BLUE SHIELD | Source: Ambulatory Visit | Attending: Family Medicine | Admitting: Family Medicine

## 2015-01-04 DIAGNOSIS — M79672 Pain in left foot: Secondary | ICD-10-CM

## 2015-05-18 LAB — OB RESULTS CONSOLE GBS: STREP GROUP B AG: NEGATIVE

## 2015-05-25 ENCOUNTER — Encounter (HOSPITAL_COMMUNITY): Payer: Self-pay | Admitting: *Deleted

## 2015-05-25 ENCOUNTER — Telehealth (HOSPITAL_COMMUNITY): Payer: Self-pay | Admitting: *Deleted

## 2015-05-25 NOTE — Telephone Encounter (Signed)
Preadmission screen  

## 2015-05-31 ENCOUNTER — Encounter (HOSPITAL_COMMUNITY): Payer: Self-pay

## 2015-05-31 ENCOUNTER — Encounter (HOSPITAL_COMMUNITY)
Admission: RE | Admit: 2015-05-31 | Discharge: 2015-05-31 | Disposition: A | Discharge status: Home / Self Care | Payer: BLUE CROSS/BLUE SHIELD | Source: Ambulatory Visit | Attending: Obstetrics & Gynecology | Admitting: Obstetrics & Gynecology

## 2015-05-31 LAB — CBC
HCT: 33.9 % — ABNORMAL LOW (ref 36.0–46.0)
Hemoglobin: 10.5 g/dL — ABNORMAL LOW (ref 12.0–15.0)
MCH: 24.7 pg — ABNORMAL LOW (ref 26.0–34.0)
MCHC: 31 g/dL (ref 30.0–36.0)
MCV: 79.8 fL (ref 78.0–100.0)
PLATELETS: 177 10*3/uL (ref 150–400)
RBC: 4.25 MIL/uL (ref 3.87–5.11)
RDW: 15.2 % (ref 11.5–15.5)
WBC: 7.5 10*3/uL (ref 4.0–10.5)

## 2015-05-31 NOTE — Patient Instructions (Signed)
[name]  05/31/2015   Your procedure is scheduled on:  06/01/2015  [facility] at 1100 AM.  Pick up the phone at the desk and dial [phone].   Call this number if you have problems the morning of surgery: [phone]   Remember:   Do not eat food:After Midnight.  Do not drink clear liquids: After Midnight.  Take these medicines the morning of surgery with A SIP OF WATER: none   Do not wear jewelry, make-up or nail polish.  Do not wear lotions, powders, or perfumes. You may wear deodorant.  Do not shave 48 hours prior to surgery.  Do not bring valuables to the hospital.  [facility] not   responsible for any belongings or valuables brought to the hospital.  Contacts, dentures or bridgework may not be worn into surgery.  Leave suitcase in the car. After surgery it may be brought to your room.  For patients admitted to the hospital, checkout time is 11:00 AM the day of              discharge.   Patients discharged the day of surgery will not be allowed to drive             home.  Name and phone number of your driver: none  Special Instructions:   Shower using CHG 2 nights before surgery and the night before surgery.  If you shower the day of surgery use CHG.  Use special wash - you have one bottle of CHG for all showers.  You should use approximately 1/3 of the bottle for each shower.   Please read over the following fact sheets that you were given:   Surgical Site Infection Prevention

## 2015-06-01 ENCOUNTER — Inpatient Hospital Stay (HOSPITAL_COMMUNITY): Payer: BLUE CROSS/BLUE SHIELD | Admitting: Anesthesiology

## 2015-06-01 ENCOUNTER — Encounter (HOSPITAL_COMMUNITY): Payer: Self-pay | Admitting: Anesthesiology

## 2015-06-01 ENCOUNTER — Encounter (HOSPITAL_COMMUNITY)
Admission: RE | Disposition: A | Discharge status: Home / Self Care | Payer: Self-pay | Source: Ambulatory Visit | Attending: Obstetrics & Gynecology

## 2015-06-01 ENCOUNTER — Inpatient Hospital Stay (HOSPITAL_COMMUNITY)
Admission: RE | Admit: 2015-06-01 | Discharge: 2015-06-04 | DRG: 766 | Disposition: A | Discharge status: Home / Self Care | Payer: BLUE CROSS/BLUE SHIELD | Source: Ambulatory Visit | Attending: Obstetrics & Gynecology | Admitting: Obstetrics & Gynecology

## 2015-06-01 DIAGNOSIS — Z3A39 39 weeks gestation of pregnancy: Secondary | ICD-10-CM | POA: Diagnosis not present

## 2015-06-01 DIAGNOSIS — O403XX0 Polyhydramnios, third trimester, not applicable or unspecified: Principal | ICD-10-CM | POA: Diagnosis present

## 2015-06-01 DIAGNOSIS — O335XX0 Maternal care for disproportion due to unusually large fetus, not applicable or unspecified: Secondary | ICD-10-CM | POA: Diagnosis present

## 2015-06-01 DIAGNOSIS — Z9889 Other specified postprocedural states: Secondary | ICD-10-CM

## 2015-06-01 LAB — PREPARE RBC (CROSSMATCH)

## 2015-06-01 LAB — RPR: RPR Ser Ql: NONREACTIVE

## 2015-06-01 SURGERY — Surgical Case
Anesthesia: Spinal

## 2015-06-01 MED ORDER — DEXAMETHASONE SODIUM PHOSPHATE 10 MG/ML IJ SOLN
INTRAMUSCULAR | Status: AC
  Filled 2015-06-01: qty 1

## 2015-06-01 MED ORDER — LACTATED RINGERS IV SOLN
INTRAVENOUS | Status: DC | PRN
  Administered 2015-06-01: 13:00:00 via INTRAVENOUS

## 2015-06-01 MED ORDER — ATROPINE SULFATE 0.4 MG/ML IJ SOLN
INTRAMUSCULAR | Status: AC
  Filled 2015-06-01: qty 1

## 2015-06-01 MED ORDER — DIBUCAINE 1 % RE OINT: 1.0000 "application " | TOPICAL_OINTMENT | RECTAL | Status: DC | PRN

## 2015-06-01 MED ORDER — SIMETHICONE 80 MG PO CHEW: 80.0000 mg | CHEWABLE_TABLET | ORAL | Status: DC | PRN

## 2015-06-01 MED ORDER — LACTATED RINGERS IV SOLN
Freq: Once | INTRAVENOUS | Status: AC
  Administered 2015-06-01: 11:00:00 via INTRAVENOUS

## 2015-06-01 MED ORDER — FENTANYL CITRATE (PF) 100 MCG/2ML IJ SOLN
INTRAMUSCULAR | Status: AC
  Filled 2015-06-01: qty 2

## 2015-06-01 MED ORDER — BUPIVACAINE HCL (PF) 0.25 % IJ SOLN
INTRAMUSCULAR | Status: AC
  Filled 2015-06-01: qty 20

## 2015-06-01 MED ORDER — NALBUPHINE HCL 10 MG/ML IJ SOLN
INTRAMUSCULAR | Status: AC
  Filled 2015-06-01: qty 1

## 2015-06-01 MED ORDER — NALBUPHINE HCL 10 MG/ML IJ SOLN
5.0000 mg | INTRAMUSCULAR | Status: DC | PRN
  Administered 2015-06-01: 5 mg via INTRAVENOUS
  Filled 2015-06-01 (×2): qty 1

## 2015-06-01 MED ORDER — BUPIVACAINE HCL 0.25 % IJ SOLN
INTRAMUSCULAR | Status: DC | PRN
  Administered 2015-06-01: 20 mL

## 2015-06-01 MED ORDER — SCOPOLAMINE 1 MG/3DAYS TD PT72
MEDICATED_PATCH | TRANSDERMAL | Status: AC
  Administered 2015-06-01: 1.5 mg via TRANSDERMAL
  Filled 2015-06-01: qty 1

## 2015-06-01 MED ORDER — EPHEDRINE 5 MG/ML INJ
INTRAVENOUS | Status: AC
  Filled 2015-06-01: qty 20

## 2015-06-01 MED ORDER — KETOROLAC TROMETHAMINE 30 MG/ML IJ SOLN
30.0000 mg | Freq: Four times a day (QID) | INTRAMUSCULAR | Status: AC | PRN
  Administered 2015-06-01: 30 mg via INTRAMUSCULAR

## 2015-06-01 MED ORDER — TETANUS-DIPHTH-ACELL PERTUSSIS 5-2.5-18.5 LF-MCG/0.5 IM SUSP: 0.5000 mL | Freq: Once | INTRAMUSCULAR | Status: DC

## 2015-06-01 MED ORDER — WITCH HAZEL-GLYCERIN EX PADS: 1.0000 "application " | MEDICATED_PAD | CUTANEOUS | Status: DC | PRN

## 2015-06-01 MED ORDER — SCOPOLAMINE 1 MG/3DAYS TD PT72: 1.0000 | MEDICATED_PATCH | Freq: Once | TRANSDERMAL | Status: DC

## 2015-06-01 MED ORDER — KETOROLAC TROMETHAMINE 30 MG/ML IJ SOLN
INTRAMUSCULAR | Status: AC
  Filled 2015-06-01: qty 1

## 2015-06-01 MED ORDER — NALOXONE HCL 2 MG/2ML IJ SOSY
1.0000 ug/kg/h | PREFILLED_SYRINGE | INTRAVENOUS | Status: DC | PRN
  Filled 2015-06-01: qty 2

## 2015-06-01 MED ORDER — ZOLPIDEM TARTRATE 5 MG PO TABS: 5.0000 mg | ORAL_TABLET | Freq: Every evening | ORAL | Status: DC | PRN

## 2015-06-01 MED ORDER — IBUPROFEN 600 MG PO TABS
600.0000 mg | ORAL_TABLET | Freq: Four times a day (QID) | ORAL | Status: DC
  Administered 2015-06-01 – 2015-06-03 (×9): 600 mg via ORAL
  Filled 2015-06-01 (×12): qty 1

## 2015-06-01 MED ORDER — FENTANYL CITRATE (PF) 100 MCG/2ML IJ SOLN
INTRAMUSCULAR | Status: DC | PRN
  Administered 2015-06-01: 20 ug via INTRATHECAL

## 2015-06-01 MED ORDER — DIPHENHYDRAMINE HCL 50 MG/ML IJ SOLN
INTRAMUSCULAR | Status: AC
  Filled 2015-06-01: qty 1

## 2015-06-01 MED ORDER — CEFAZOLIN SODIUM-DEXTROSE 2-3 GM-% IV SOLR
INTRAVENOUS | Status: DC | PRN
  Administered 2015-06-01: 2 g via INTRAVENOUS

## 2015-06-01 MED ORDER — ACETAMINOPHEN 325 MG PO TABS: 650.0000 mg | ORAL_TABLET | ORAL | Status: DC | PRN

## 2015-06-01 MED ORDER — KETOROLAC TROMETHAMINE 30 MG/ML IJ SOLN: 30.0000 mg | Freq: Four times a day (QID) | INTRAMUSCULAR | Status: AC | PRN

## 2015-06-01 MED ORDER — ONDANSETRON HCL 4 MG/2ML IJ SOLN
INTRAMUSCULAR | Status: DC | PRN
  Administered 2015-06-01: 4 mg via INTRAVENOUS

## 2015-06-01 MED ORDER — BUPIVACAINE IN DEXTROSE 0.75-8.25 % IT SOLN
INTRATHECAL | Status: DC | PRN
  Administered 2015-06-01: 1.6 mL via INTRATHECAL

## 2015-06-01 MED ORDER — ACETAMINOPHEN 500 MG PO TABS
1000.0000 mg | ORAL_TABLET | Freq: Four times a day (QID) | ORAL | Status: AC
  Administered 2015-06-01 – 2015-06-02 (×3): 1000 mg via ORAL
  Filled 2015-06-01 (×3): qty 2

## 2015-06-01 MED ORDER — NALBUPHINE HCL 10 MG/ML IJ SOLN: 5.0000 mg | Freq: Once | INTRAMUSCULAR | Status: AC | PRN

## 2015-06-01 MED ORDER — MAGNESIUM HYDROXIDE 400 MG/5ML PO SUSP: 30.0000 mL | ORAL | Status: DC | PRN

## 2015-06-01 MED ORDER — DIPHENHYDRAMINE HCL 25 MG PO CAPS: 25.0000 mg | ORAL_CAPSULE | Freq: Four times a day (QID) | ORAL | Status: DC | PRN

## 2015-06-01 MED ORDER — NALBUPHINE HCL 10 MG/ML IJ SOLN
5.0000 mg | Freq: Once | INTRAMUSCULAR | Status: AC | PRN
  Administered 2015-06-01: 5 mg via SUBCUTANEOUS

## 2015-06-01 MED ORDER — PRENATAL MULTIVITAMIN CH
1.0000 | ORAL_TABLET | Freq: Every day | ORAL | Status: DC
  Administered 2015-06-03 – 2015-06-04 (×3): 1 via ORAL
  Filled 2015-06-01 (×4): qty 1

## 2015-06-01 MED ORDER — PHENYLEPHRINE 8 MG IN D5W 100 ML (0.08MG/ML) PREMIX OPTIME
INJECTION | INTRAVENOUS | Status: AC
  Filled 2015-06-01: qty 100

## 2015-06-01 MED ORDER — MENTHOL 3 MG MT LOZG: 1.0000 | LOZENGE | OROMUCOSAL | Status: DC | PRN

## 2015-06-01 MED ORDER — LACTATED RINGERS IV SOLN
INTRAVENOUS | Status: DC
  Administered 2015-06-01: 20:00:00 via INTRAVENOUS

## 2015-06-01 MED ORDER — SENNOSIDES-DOCUSATE SODIUM 8.6-50 MG PO TABS
2.0000 | ORAL_TABLET | ORAL | Status: DC
  Administered 2015-06-01 – 2015-06-03 (×3): 2 via ORAL
  Filled 2015-06-01 (×3): qty 2

## 2015-06-01 MED ORDER — IBUPROFEN 600 MG PO TABS
600.0000 mg | ORAL_TABLET | Freq: Four times a day (QID) | ORAL | Status: DC | PRN
  Administered 2015-06-02: 600 mg via ORAL
  Filled 2015-06-01: qty 1

## 2015-06-01 MED ORDER — COCONUT OIL OIL: 1.0000 "application " | TOPICAL_OIL | Status: DC | PRN

## 2015-06-01 MED ORDER — SUCCINYLCHOLINE CHLORIDE 20 MG/ML IJ SOLN
INTRAMUSCULAR | Status: AC
  Filled 2015-06-01: qty 1

## 2015-06-01 MED ORDER — OXYTOCIN 40 UNITS IN LACTATED RINGERS INFUSION - SIMPLE MED: 2.5000 [IU]/h | INTRAVENOUS | Status: AC

## 2015-06-01 MED ORDER — OXYTOCIN 10 UNIT/ML IJ SOLN
INTRAMUSCULAR | Status: AC
  Filled 2015-06-01: qty 4

## 2015-06-01 MED ORDER — LIDOCAINE HCL 1 % IJ SOLN
INTRAMUSCULAR | Status: AC
  Filled 2015-06-01: qty 20

## 2015-06-01 MED ORDER — NALBUPHINE HCL 10 MG/ML IJ SOLN: 5.0000 mg | INTRAMUSCULAR | Status: DC | PRN

## 2015-06-01 MED ORDER — HYDROMORPHONE HCL 1 MG/ML IJ SOLN: 0.2500 mg | INTRAMUSCULAR | Status: DC | PRN

## 2015-06-01 MED ORDER — DIPHENHYDRAMINE HCL 25 MG PO CAPS
25.0000 mg | ORAL_CAPSULE | ORAL | Status: DC | PRN
  Filled 2015-06-01: qty 1

## 2015-06-01 MED ORDER — FERROUS SULFATE 325 (65 FE) MG PO TABS
325.0000 mg | ORAL_TABLET | Freq: Two times a day (BID) | ORAL | Status: DC
  Administered 2015-06-01 – 2015-06-04 (×6): 325 mg via ORAL
  Filled 2015-06-01 (×6): qty 1

## 2015-06-01 MED ORDER — BUPIVACAINE IN DEXTROSE 0.75-8.25 % IT SOLN
INTRATHECAL | Status: AC
  Filled 2015-06-01: qty 2

## 2015-06-01 MED ORDER — SODIUM CHLORIDE 0.9 % IR SOLN
Status: DC | PRN
  Administered 2015-06-01: 1000 mL

## 2015-06-01 MED ORDER — CEFAZOLIN SODIUM-DEXTROSE 2-4 GM/100ML-% IV SOLN
INTRAVENOUS | Status: AC
  Filled 2015-06-01: qty 100

## 2015-06-01 MED ORDER — PHENYLEPHRINE 8 MG IN D5W 100 ML (0.08MG/ML) PREMIX OPTIME
INJECTION | INTRAVENOUS | Status: DC | PRN
  Administered 2015-06-01: 60 ug/min via INTRAVENOUS

## 2015-06-01 MED ORDER — MEPERIDINE HCL 25 MG/ML IJ SOLN: 6.2500 mg | INTRAMUSCULAR | Status: DC | PRN

## 2015-06-01 MED ORDER — SIMETHICONE 80 MG PO CHEW
80.0000 mg | CHEWABLE_TABLET | Freq: Three times a day (TID) | ORAL | Status: DC
  Administered 2015-06-02 – 2015-06-04 (×6): 80 mg via ORAL
  Filled 2015-06-01 (×6): qty 1

## 2015-06-01 MED ORDER — MORPHINE SULFATE (PF) 0.5 MG/ML IJ SOLN
INTRAMUSCULAR | Status: AC
  Filled 2015-06-01: qty 10

## 2015-06-01 MED ORDER — DIPHENHYDRAMINE HCL 50 MG/ML IJ SOLN: 12.5000 mg | INTRAMUSCULAR | Status: DC | PRN

## 2015-06-01 MED ORDER — SODIUM CHLORIDE 0.9% FLUSH: 3.0000 mL | INTRAVENOUS | Status: DC | PRN

## 2015-06-01 MED ORDER — ONDANSETRON HCL 4 MG/2ML IJ SOLN: 4.0000 mg | Freq: Once | INTRAMUSCULAR | Status: DC | PRN

## 2015-06-01 MED ORDER — KETOROLAC TROMETHAMINE 30 MG/ML IJ SOLN: 30.0000 mg | Freq: Four times a day (QID) | INTRAMUSCULAR | Status: DC

## 2015-06-01 MED ORDER — EPHEDRINE SULFATE 50 MG/ML IJ SOLN
INTRAMUSCULAR | Status: DC | PRN
  Administered 2015-06-01 (×5): 10 mg via INTRAVENOUS

## 2015-06-01 MED ORDER — SCOPOLAMINE 1 MG/3DAYS TD PT72
1.0000 | MEDICATED_PATCH | Freq: Once | TRANSDERMAL | Status: DC
  Administered 2015-06-01: 1.5 mg via TRANSDERMAL

## 2015-06-01 MED ORDER — ONDANSETRON HCL 4 MG/2ML IJ SOLN
4.0000 mg | Freq: Three times a day (TID) | INTRAMUSCULAR | Status: DC | PRN
  Filled 2015-06-01: qty 2

## 2015-06-01 MED ORDER — OXYTOCIN 10 UNIT/ML IJ SOLN
40.0000 [IU] | INTRAVENOUS | Status: DC | PRN
  Administered 2015-06-01: 40 [IU] via INTRAVENOUS

## 2015-06-01 MED ORDER — LACTATED RINGERS IV SOLN
INTRAVENOUS | Status: DC
  Administered 2015-06-01 (×3): via INTRAVENOUS

## 2015-06-01 MED ORDER — ONDANSETRON HCL 4 MG/2ML IJ SOLN
INTRAMUSCULAR | Status: AC
  Filled 2015-06-01: qty 2

## 2015-06-01 MED ORDER — SIMETHICONE 80 MG PO CHEW
80.0000 mg | CHEWABLE_TABLET | ORAL | Status: DC
  Administered 2015-06-01 – 2015-06-03 (×3): 80 mg via ORAL
  Filled 2015-06-01 (×4): qty 1

## 2015-06-01 MED ORDER — MORPHINE SULFATE (PF) 0.5 MG/ML IJ SOLN
INTRAMUSCULAR | Status: DC | PRN
  Administered 2015-06-01: .2 mg via INTRATHECAL

## 2015-06-01 MED ORDER — BUPIVACAINE HCL (PF) 0.25 % IJ SOLN
INTRAMUSCULAR | Status: AC
  Filled 2015-06-01: qty 30

## 2015-06-01 MED ORDER — NALOXONE HCL 0.4 MG/ML IJ SOLN: 0.4000 mg | INTRAMUSCULAR | Status: DC | PRN

## 2015-06-01 SURGICAL SUPPLY — 39 items
CLAMP CORD UMBIL (MISCELLANEOUS) IMPLANT
CLOTH BEACON ORANGE TIMEOUT ST (SAFETY) ×3 IMPLANT
CONTAINER PREFILL 10% NBF 15ML (MISCELLANEOUS) IMPLANT
DEVICE BLD TRNS LUER ATTCH (MISCELLANEOUS) ×3 IMPLANT
DRSG OPSITE POSTOP 4X10 (GAUZE/BANDAGES/DRESSINGS) ×3 IMPLANT
DURAPREP 26ML APPLICATOR (WOUND CARE) ×3 IMPLANT
ELECT REM PT RETURN 9FT ADLT (ELECTROSURGICAL) ×3
ELECTRODE REM PT RTRN 9FT ADLT (ELECTROSURGICAL) ×1 IMPLANT
EXTRACTOR VACUUM M CUP 4 TUBE (SUCTIONS) IMPLANT
EXTRACTOR VACUUM M CUP 4' TUBE (SUCTIONS)
GLOVE BIO SURGEON STRL SZ 6.5 (GLOVE) ×2 IMPLANT
GLOVE BIO SURGEONS STRL SZ 6.5 (GLOVE) ×1
GLOVE BIOGEL PI IND STRL 7.0 (GLOVE) ×2 IMPLANT
GLOVE BIOGEL PI INDICATOR 7.0 (GLOVE) ×4
GOWN STRL REUS W/TWL LRG LVL3 (GOWN DISPOSABLE) ×6 IMPLANT
KIT ABG SYR 3ML LUER SLIP (SYRINGE) IMPLANT
LIQUID BAND (GAUZE/BANDAGES/DRESSINGS) ×3 IMPLANT
NEEDLE HYPO 22GX1.5 SAFETY (NEEDLE) ×3 IMPLANT
NEEDLE HYPO 25X5/8 SAFETYGLIDE (NEEDLE) IMPLANT
PACK C SECTION WH (CUSTOM PROCEDURE TRAY) ×3 IMPLANT
PAD OB MATERNITY 4.3X12.25 (PERSONAL CARE ITEMS) ×3 IMPLANT
RTRCTR C-SECT PINK 25CM LRG (MISCELLANEOUS) ×3 IMPLANT
SUT MNCRL AB 3-0 PS2 27 (SUTURE) IMPLANT
SUT MON AB 4-0 PS1 27 (SUTURE) IMPLANT
SUT PLAIN 0 NONE (SUTURE) IMPLANT
SUT PLAIN 2 0 (SUTURE) ×2
SUT PLAIN ABS 2-0 CT1 27XMFL (SUTURE) ×1 IMPLANT
SUT VIC AB 0 CT1 27 (SUTURE) ×4
SUT VIC AB 0 CT1 27XBRD ANBCTR (SUTURE) ×2 IMPLANT
SUT VIC AB 0 CTX 36 (SUTURE) ×4
SUT VIC AB 0 CTX36XBRD ANBCTRL (SUTURE) ×2 IMPLANT
SUT VIC AB 2-0 CT1 27 (SUTURE) ×2
SUT VIC AB 2-0 CT1 TAPERPNT 27 (SUTURE) ×1 IMPLANT
SUT VIC AB 3-0 SH 27 (SUTURE)
SUT VIC AB 3-0 SH 27X BRD (SUTURE) IMPLANT
SUT VIC AB 4-0 PS2 27 (SUTURE) IMPLANT
SYR CONTROL 10ML LL (SYRINGE) ×3 IMPLANT
TOWEL OR 17X24 6PK STRL BLUE (TOWEL DISPOSABLE) ×3 IMPLANT
TRAY FOLEY CATH SILVER 14FR (SET/KITS/TRAYS/PACK) ×3 IMPLANT

## 2015-06-01 NOTE — H&P (Signed)
Sherry [name] is an 30 y.o. female G3P0020 39 wks  RP:  Primary Elective C/S for Macrosomia   Pertinent Gynecological History:  Blood transfusions: none Sexually transmitted diseases: no past history Last pap: normal  OB History: G3P0020   Menstrual History:  Patient's last menstrual period was 08/27/2014.    Past Medical History  Diagnosis Date  . Allergy   . Anxiety     Past Surgical History  Procedure Laterality Date  . No past surgeries    . Robotic assisted laparoscopic ovarian cystectomy Left 04/22/2014    Procedure: ROBOTIC ASSISTED LAPAROSCOPIC OVARIAN CYSTECTOMY;  Surgeon: Genia DelMarie-Lyne Maylen Waltermire, MD;  Location: [facility];  Service: Gynecology;  Laterality: Left;  . Chromopertubation N/A 04/22/2014    Procedure: CHROMOPERTUBATION;  Surgeon: Genia DelMarie-Lyne Deryl Ports, MD;  Location: [facility];  Service: Gynecology;  Laterality: N/A;  Fallopian Tubes    Family History  Problem Relation Age of Onset  . Depression Mother   . Varicose Veins Mother   . Cancer Father     pancreatic  . Stroke Father     Social History:  reports that she has never smoked. She has never used smokeless tobacco. She reports that she drinks about 1.2 - 1.8 oz of alcohol per week. She reports that she does not use illicit drugs.  Allergies: No Known Allergies  Prescriptions prior to admission  Medication Sig Dispense Refill Last Dose  . acetaminophen (TYLENOL) 325 MG tablet Take 650-975 mg by mouth every 6 (six) hours as needed for mild pain or headache.   Past Week at Unknown time  . calcium carbonate (TUMS - DOSED IN MG ELEMENTAL CALCIUM) 500 MG chewable tablet Chew 1 tablet by mouth as needed for indigestion or heartburn. Patient takes 1 tablet 6-8 times daily.   05/31/2015 at Unknown time  . doxylamine, Sleep, (UNISOM) 25 MG tablet Take 25 mg by mouth at bedtime as needed for sleep.   Past Week at Unknown time  . Prenatal Vit-Fe Fumarate-FA (PRENATAL MULTIVITAMIN) TABS tablet Take 1 tablet by mouth daily  at 12 noon.   05/31/2015 at Unknown time    ROS Neg  Blood pressure 139/96, pulse 84, temperature 98.4 F (36.9 C), temperature source Oral, resp. rate 20, last menstrual period 08/27/2014, SpO2 100 %. Physical Exam  Results for orders placed or performed during the hospital encounter of 06/01/15 (from the past 24 hour(s))  Prepare RBC (crossmatch)     Status: None   Collection Time: 06/01/15 11:30 AM  Result Value Ref Range   Order Confirmation ORDER PROCESSED BY BLOOD BANK      Assessment/Plan: 39 wks Macrosomia EFW 11 Lbs for Primary Elective C/S.  Surgery and risks reviewed.  Informed consent signed.  Sherry [name],MARIE-LYNE 06/01/2015, 12:43 PM

## 2015-06-01 NOTE — Anesthesia Postprocedure Evaluation (Signed)
Anesthesia Post Note  Patient: Sherry [name]  Procedure(s) Performed: Procedure(s) (LRB): PRIMARY CESAREAN SECTION (N/A)  Patient location during evaluation: PACU Anesthesia Type: Spinal Level of consciousness: awake Pain management: pain level controlled Vital Signs Assessment: post-procedure vital signs reviewed and stable Respiratory status: spontaneous breathing Cardiovascular status: stable Postop Assessment: no headache, spinal receding, no backache, patient able to bend at knees and no signs of nausea or vomiting Anesthetic complications: no     Last Vitals:  Filed Vitals:   06/01/15 1418 06/01/15 1419  BP:    Pulse: 110 109  Temp:    Resp: 24 19    Last Pain: There were no vitals filed for this visit. Pain Goal: Patients Stated Pain Goal: 4 (06/01/15 1101)               [name],JOHN Susann GivensFRANKLIN

## 2015-06-01 NOTE — Anesthesia Preprocedure Evaluation (Signed)
Anesthesia Evaluation  Patient identified by MRN, date of birth, ID band Patient awake    Reviewed: Allergy & Precautions, H&P , NPO status , Patient's Chart, lab work & pertinent test results  Airway Mallampati: II  TM Distance: >3 FB Neck ROM: full    Dental no notable dental hx.    Pulmonary neg pulmonary ROS,    Pulmonary exam normal        Cardiovascular negative cardio ROS Normal cardiovascular exam     Neuro/Psych negative neurological ROS     GI/Hepatic negative GI ROS, Neg liver ROS,   Endo/Other  negative endocrine ROS  Renal/GU negative Renal ROS     Musculoskeletal   Abdominal (+) + obese,   Peds  Hematology negative hematology ROS (+)   Anesthesia Other Findings   Reproductive/Obstetrics (+) Pregnancy                             Anesthesia Physical Anesthesia Plan  ASA: II  Anesthesia Plan: Spinal   Post-op Pain Management:    Induction:   Airway Management Planned:   Additional Equipment:   Intra-op Plan:   Post-operative Plan:   Informed Consent: I have reviewed the patients History and Physical, chart, labs and discussed the procedure including the risks, benefits and alternatives for the proposed anesthesia with the patient or authorized representative who has indicated his/her understanding and acceptance.     Plan Discussed with: CRNA and Surgeon  Anesthesia Plan Comments:        Anesthesia Quick Evaluation  

## 2015-06-01 NOTE — Anesthesia Procedure Notes (Signed)
Spinal Patient location during procedure: OR Start time: 06/01/2015 12:56 PM End time: 06/01/2015 1:00 PM Staffing Anesthesiologist: Leilani AbleHATCHETT, Chyna Kneece Performed by: anesthesiologist  Preanesthetic Checklist Completed: patient identified, surgical consent, pre-op evaluation, timeout performed, IV checked, risks and benefits discussed and monitors and equipment checked Spinal Block Patient position: sitting Prep: site prepped and draped and DuraPrep Patient monitoring: heart rate, cardiac monitor, continuous pulse ox and blood pressure Approach: midline Location: L3-4 Injection technique: single-shot Needle Needle type: Sprotte  Needle gauge: 24 G Needle length: 9 cm Needle insertion depth: 8 cm Assessment Sensory level: T2

## 2015-06-01 NOTE — Op Note (Signed)
Preoperative diagnosis: Intrauterine pregnancy at 39 weeks and 0 days                                             Polyhydramnios, macrosomia  Post operative diagnosis: Same  Anesthesia: Spinal  Anesthesiologist: Dr. Arby BarretteHatchett  Procedure: Primary low transverse cesarean section  Surgeon: Dr. Genia DelMarie-Lyne Niles Ess  Assistant: Marlinda Mikeanya Bailey   Estimated blood loss: 1000 cc  Procedure:  After being informed of the planned procedure and possible complications including bleeding, infection, injury to other organs, informed consent is obtained. The patient is taken to OR #9 and given spinal anesthesia without complication. She is placed in the dorsal decubitus position with the pelvis tilted to the left. She is then prepped and draped in a sterile fashion. A Foley catheter is inserted in her bladder.  After assessing adequate level of anesthesia, we infiltrate the suprapubic area with 20 cc of Marcaine 0.25 and perform a Pfannenstiel incision which is brought down sharply to the fascia. The fascia is entered in a low transverse fashion. Linea alba is dissected. Peritoneum is entered in a midline fashion. An Alexis retractor is easily positioned. Visceral peritoneum is entered in a low transverse fashion allowing us to safely retract bladder by developing a bladder flap.  The myometrium is then entered in a low transverse fashion; first with knife and then extended bluntly. Amniotic fluid is abundant and clear. We assist the birth of a female  infant in cephalic presentation. Mouth and nose are suctioned. The baby is delivered. The cord is clamped and sectioned. The baby is given to the neonatologist present in the room.  10 cc of blood is drawn from the umbilical vein.The placenta is allowed to deliver spontaneously. It is complete and the cord has 3 vessels. Uterine revision is negative.  We proceed with closure of the myometrium in 2 layers: First with a running locked suture of 0 Vicryl, then with a  Lembert suture of 0 Vicryl imbricating the first one. Hemostasis is completed with cauterization on peritoneal edges.  Both paracolic gutters are cleaned. Both tubes and ovaries are assessed and normal. The pelvis is profusely irrigated with warm saline to confirm a satisfactory hemostasis.  Retractors and sponges are removed. Under fascia hemostasis is completed with cauterization.  The parietal peritoneum is closed with a running suture of Vicryl 2-0.  The fascia is then closed with 2 running sutures of 0 Vicryl meeting midline. The wound is irrigated with warm saline and hemostasis is completed with cauterization.  The adipose tissue is approximated with Plain 2-0. The skin is closed with a subcuticular suture of 3-0 Monocryl and Dermabond.  A Honeycomb dressing is added.  Instrument and sponge count is complete x2. Estimated blood loss is 1000 cc.  The procedure is well tolerated by the patient who is taken to recovery room in a well and stable condition.  female baby was born at 13:24 and received an Apgars were pending per NICU. Weight was 10 Lbs 12 Oz.   Specimen: Placenta sent to [facility]   Jetaime Pinnix,MARIE-LYNE MD 5/24/20172:03 PM

## 2015-06-01 NOTE — Transfer of Care (Signed)
Immediate Anesthesia Transfer of Care Note  Patient: Sherry KollerJessica C Kellis  Procedure(s) Performed: Procedure(s): PRIMARY CESAREAN SECTION (N/A)  Patient Location: PACU  Anesthesia Type:Spinal  Level of Consciousness: awake, alert  and oriented  Airway & Oxygen Therapy: Patient Spontanous Breathing  Post-op Assessment: Report given to RN  Post vital signs: Reviewed  Last Vitals:  Filed Vitals:   06/01/15 1101  BP: 139/96  Pulse: 84  Temp: 36.9 C  Resp: 20    Last Pain: There were no vitals filed for this visit.    Patients Stated Pain Goal: 4 (06/01/15 1101)  Complications: No apparent anesthesia complications

## 2015-06-02 ENCOUNTER — Encounter (HOSPITAL_COMMUNITY): Payer: Self-pay | Admitting: Obstetrics and Gynecology

## 2015-06-02 LAB — CBC
HCT: 24.5 % — ABNORMAL LOW (ref 36.0–46.0)
HEMOGLOBIN: 7.6 g/dL — AB (ref 12.0–15.0)
MCH: 24.6 pg — ABNORMAL LOW (ref 26.0–34.0)
MCHC: 31 g/dL (ref 30.0–36.0)
MCV: 79.3 fL (ref 78.0–100.0)
Platelets: 155 10*3/uL (ref 150–400)
RBC: 3.09 MIL/uL — ABNORMAL LOW (ref 3.87–5.11)
RDW: 15.5 % (ref 11.5–15.5)
WBC: 8.6 10*3/uL (ref 4.0–10.5)

## 2015-06-02 LAB — BIRTH TISSUE RECOVERY COLLECTION (PLACENTA DONATION)

## 2015-06-02 NOTE — Addendum Note (Signed)
Addendum  created 06/02/15 1443 by Graciela HusbandsWynn O Nihal Doan, CRNA   Modules edited: Notes Section   Notes Section:  File: [id number]

## 2015-06-02 NOTE — Anesthesia Postprocedure Evaluation (Signed)
Anesthesia Post Note  Patient: Sherry [name]  Procedure(s) Performed: Procedure(s) (LRB): PRIMARY CESAREAN SECTION (N/A)  Patient location during evaluation: [facility] Anesthesia Type: Spinal Level of consciousness: awake and alert and oriented Pain management: satisfactory to patient Vital Signs Assessment: post-procedure vital signs reviewed and stable Respiratory status: respiratory function stable and spontaneous breathing Cardiovascular status: blood pressure returned to baseline Postop Assessment: no headache, no backache, spinal receding, patient able to bend at knees and adequate PO intake Anesthetic complications: no     Last Vitals:  Filed Vitals:   06/02/15 0635 06/02/15 1316  BP: 104/59 115/61  Pulse: 78 86  Temp: 36.8 C 37 C  Resp: 18 18    Last Pain:  Filed Vitals:   06/02/15 1316  PainSc: 3    Pain Goal: Patients Stated Pain Goal: 3 (06/02/15 0830)               Maigan Bittinger

## 2015-06-02 NOTE — Lactation Note (Signed)
This note was copied from a baby's chart. Lactation Consultation Note  Patient Name: Sherry [name]: 06/02/2015 Reason for consult: Initial assessment   With this mom of a term, LGA baby in NICU, due ti initial RDS due to lots of oral and nasal secretions. Baby now on HFNC 1 L and 21 % oxygen. Mom is pumping up to 15 -20 mls per side, and has breast fed the baby once so far. The baby fed from both sides, with good breast movement, and was satiated after, as per NICu Brynda Rimn Carol. Basic NICU breast feeding/pumping teaching done  with mom.  Mom to go to NICU now to breast feed. Mom knows to call for questions/concerns.    Maternal Data Formula Feeding for Exclusion: Yes (babyh in NICU) Has patient been taught Hand Expression?: Yes Does the patient have breastfeeding experience prior to this delivery?: No  Feeding    LATCH Score/Interventions                      Lactation Tools Discussed/Used WIC Program: No Pump Review: Setup, frequency, and cleaning;Milk Storage;Other (comment) (pump settings, hand expression, NICU book) Initiated by:: bedside Rn [name] initiated:: 06/01/15   Consult Status Consult Status: Follow-up [name]: 06/03/15 Follow-up type: In-patient    Sherry [name], Sherry [name] 06/02/2015, 5:38 PM

## 2015-06-02 NOTE — Progress Notes (Signed)
Patient ID: Sherry [name], female   DOB: 06/18/1985, 30 y.o.   MRN: [id number] Subjective: S/P Primary Cesarean Delivery for Polyhydramnios / Macrosomia POD# 1 Information for the patient's newborn:  Sherry [name], Boy Vivien [[id number]][[id number]]  female  / circ planning prior to d/c  Reports feeling well. Feeding: bottle while on O2 therapy in NICU Patient reports tolerating PO.  Breast symptoms: pumping without difficulty Pain controlled with ibuprofen (OTC) and narcotic analgesics including Percocet Denies HA/SOB/C/P/N/V/dizziness. Flatus present. No BM. She reports vaginal bleeding as normal, without clots.  She is ambulating, urinating without difficult.     Objective:   VS:  Filed Vitals:   06/01/15 1724 06/01/15 2157 06/02/15 0132 06/02/15 0635  BP: 131/74 121/85 112/61 104/59  Pulse: 84 82 86 78  Temp: 97.6 F (36.4 C) 98.2 F (36.8 C) 98 F (36.7 C) 98.3 F (36.8 C)  TempSrc: Oral Oral Oral Oral  Resp: 18 18 16 18   SpO2: 100% 99% 99% 100%     Intake/Output Summary (Last 24 hours) at 06/02/15 0856 Last data filed at 06/02/15 [id number]  Gross per 24 hour  Intake 6006.24 ml  Output   3000 ml  Net 3006.24 ml        Recent Labs  05/31/15 1150 06/02/15 0517  WBC 7.5 8.6  HGB 10.5* 7.6*  HCT 33.9* 24.5*  PLT 177 155     Blood type: --/--/O POS (05/23 1150)  Rubella: Immune (10/24 0000)     Physical Exam:   General: alert, cooperative and no distress  CV: Regular rate and rhythm, S1S2 present or without murmur or extra heart sounds  Resp: clear  Abdomen: soft, nontender, normal bowel sounds  Incision: clean, dry, intact and skin well-approximated with sutures  Uterine Fundus: firm, 1 FB below umbilicus, nontender  Lochia: minimal  Ext: extremities normal, atraumatic, no cyanosis or edema, Homans sign is negative, no sign of DVT and no edema, redness or tenderness in the calves or thighs   Assessment/Plan: 30 y.o.   POD# 1.  S/P Cesarean Delivery.  Indications:  polyhydramnios / macrosomia                Principal Problem:   Postpartum care following cesarean delivery (5/24) Active Problems:   Postoperative state  Doing well, stable.               Regular diet as tolerated D/C IV per unit protocol Ambulate Routine post-op care  Kenard GowerAWSON, Rachid Parham, M, MSN, CNM 06/02/2015, 8:56 AM

## 2015-06-03 ENCOUNTER — Inpatient Hospital Stay (HOSPITAL_COMMUNITY): Admission: RE | Admit: 2015-06-03 | Payer: BLUE CROSS/BLUE SHIELD | Source: Ambulatory Visit

## 2015-06-03 MED ORDER — OXYCODONE-ACETAMINOPHEN 5-325 MG PO TABS
1.0000 | ORAL_TABLET | ORAL | Status: DC | PRN
  Administered 2015-06-03 – 2015-06-04 (×5): 2 via ORAL
  Filled 2015-06-03 (×5): qty 2

## 2015-06-03 MED ORDER — MAGNESIUM OXIDE 400 (241.3 MG) MG PO TABS
400.0000 mg | ORAL_TABLET | Freq: Every day | ORAL | Status: DC
  Administered 2015-06-03 – 2015-06-04 (×2): 400 mg via ORAL
  Filled 2015-06-03 (×3): qty 1

## 2015-06-03 NOTE — Lactation Note (Signed)
This note was copied from a baby's chart. Lactation Consultation Note  Patient Name: Sherry Bronson IngJessica Quirino ZOXWR'UToday's Date: 06/03/2015 Reason for consult: Follow-up assessment;NICU baby Baby 45 hours old. Mom has EBM at bedside, and reports that pumping is going well. Mom reports that baby has been latching at breast, but then becomes frustrated. Discussed the difference in milk flow between bottle and breast--until milk comes to volume. Enc mom to continue to pump at least 8 times/24 hours followed by hand expression. Mom given this LC's number to call for assistance with latching baby at breast as needed. Mom aware that when she is with baby, it is best to put baby to breast first with each feeding, then supplement with her EBM, and then post-pump using DEBP. Mom reports that she has a DEBP at home, and knows to take tubing and paddles with her. Mom aware of OP/BFSG and LC phone line assistance after D/C.   Maternal Data    Feeding    LATCH Score/Interventions                      Lactation Tools Discussed/Used     Consult Status Consult Status: Follow-up Date: 06/04/15 Follow-up type: In-patient    Geralynn OchsWILLIARD, Sabena Winner 06/03/2015, 10:51 AM

## 2015-06-03 NOTE — Progress Notes (Addendum)
Patient ID: Sherry [name], female   DOB: 09/05/1985, 30 y.o.   MRN: [id number] Subjective: S/P Primary Cesarean Delivery for Polyhydramnios / Macrosomia POD# 2 Information for the patient's newborn:  Sherry [name], Boy Sherry [name] [[id number]][[id number]]  female   / circ - planning prior to d/c; in NICU  Reports feeling well. Feeding: bottle while on O2 therapy in NICU Patient reports tolerating PO.  Breast symptoms: pumping without difficulty Pain controlled with ibuprofen (OTC)  Denies HA/SOB/C/P/N/V/dizziness. Flatus present. No BM. She reports vaginal bleeding as normal, without clots.  She is ambulating, urinating without difficult.     Objective:   VS:  Filed Vitals:   06/02/15 1316 06/02/15 1742 06/02/15 2200 06/03/15 0553  BP: 115/61 130/67 118/58 122/62  Pulse: 86 86 94 81  Temp: 98.6 F (37 C)  98.2 F (36.8 C) 98.6 F (37 C)  TempSrc: Oral  Oral Oral  Resp: 18 18 20 18   SpO2: 100% 100% 100% 100%      Intake/Output Summary (Last 24 hours) at 06/03/15 1042 Last data filed at 06/02/15 1742  Gross per 24 hour  Intake      0 ml  Output    650 ml  Net   -650 ml         Recent Labs  05/31/15 1150 06/02/15 0517  WBC 7.5 8.6  HGB 10.5* 7.6*  HCT 33.9* 24.5*  PLT 177 155     Blood type: --/--/O POS (05/23 1150)  Rubella: Immune (10/24 0000)     Physical Exam:   General: alert, cooperative and no distress  CV: Regular rate and rhythm, S1S2 present or without murmur or extra heart sounds  Resp: clear  Abdomen: soft, nontender, normal bowel sounds  Incision: clean, dry, intact and skin well-approximated with sutures  Uterine Fundus: firm, 2 FB below umbilicus, nontender  Lochia: minimal  Ext: extremities normal, atraumatic, no cyanosis or edema, Homans sign is negative, no sign of DVT and no edema, redness or tenderness in the calves or thighs   Assessment/Plan: 30 y.o.   POD# 2.  S/P Cesarean Delivery.  Indications: polyhydramnios / macrosomia                Principal  Problem:   Postpartum care following cesarean delivery (5/24) Active Problems:   Postoperative state  Doing well, stable.               Regular diet as tolerated Start Percocet 1-2 tablets every 4 hours prn pain Start Magnesium Oxide 400 mg daily Ambulate Routine post-op care Anticipate D/C home tomorrow  [name], Willia Lampert, M, MSN, CNM 06/03/2015, 10:42 AM

## 2015-06-04 LAB — TYPE AND SCREEN
ABO/RH(D): O POS
ANTIBODY SCREEN: NEGATIVE
UNIT DIVISION: 0
Unit division: 0

## 2015-06-04 MED ORDER — IBUPROFEN 800 MG PO TABS
800.0000 mg | ORAL_TABLET | Freq: Four times a day (QID) | ORAL | Status: DC | PRN
  Administered 2015-06-04: 800 mg via ORAL
  Filled 2015-06-04: qty 1

## 2015-06-04 MED ORDER — FERROUS SULFATE 325 (65 FE) MG PO TABS: 325.0000 mg | ORAL_TABLET | Freq: Two times a day (BID) | ORAL | Status: DC

## 2015-06-04 MED ORDER — MAGNESIUM OXIDE 400 (241.3 MG) MG PO TABS: 400.0000 mg | ORAL_TABLET | Freq: Every day | ORAL | Status: DC

## 2015-06-04 MED ORDER — OXYCODONE-ACETAMINOPHEN 5-325 MG PO TABS: 2.0000 | ORAL_TABLET | ORAL | Status: DC | PRN

## 2015-06-04 MED ORDER — IBUPROFEN 800 MG PO TABS: 800.0000 mg | ORAL_TABLET | Freq: Four times a day (QID) | ORAL | Status: AC | PRN

## 2015-06-04 NOTE — Discharge Summary (Signed)
OB Discharge Summary     Patient Name: Sherry [name] DOB: 09/20/1985 MRN: [id number]  Date of admission: 06/01/2015 Delivering MD: Genia Del   Date of discharge: 06/04/2015  Admitting diagnosis: POLYHYDRAMNIOS Intrauterine pregnancy: [redacted]w[redacted]d     Secondary diagnosis:  Principal Problem:   Postpartum care following cesarean delivery (5/24) Active Problems:   Postoperative state  Additional problems: none     Discharge diagnosis: Term Pregnancy Delivered                                                                                                Post partum procedures:none  Augmentation: N/A  Complications: None  Hospital course:  Sceduled C/S   30 y.o. yo G3P1021 at [redacted]w[redacted]d was admitted to the hospital 06/01/2015 for scheduled cesarean section with the following indication:Macrosomia and Polyhydramnios.  Membrane Rupture Time/Date: 1:23 PM ,06/01/2015   Patient delivered a Viable infant.06/01/2015  Details of operation can be found in separate operative note.  Pateint had an uncomplicated postpartum course.  She is ambulating, tolerating a regular diet, passing flatus, and urinating well. Patient is discharged home in stable condition on  06/05/2015          Physical exam  Filed Vitals:   06/03/15 0553 06/03/15 1225 06/03/15 1838 06/03/15 2120  BP: 122/62 126/68 122/65 139/79  Pulse: 81 91 81 91  Temp: 98.6 F (37 C) 98.7 F (37.1 C) 98.2 F (36.8 C) 97.9 F (36.6 C)  TempSrc: Oral Oral Oral Oral  Resp: SpO2: 100% 100% 100% 100%   General: alert, cooperative and no distress Lochia: appropriate Uterine Fundus: firm, midline, U-2 Incision: Healing well with no significant drainage, No significant erythema, Dressing is clean, dry, and intact / skin well-approximated with sutures DVT Evaluation: No evidence of DVT seen on physical exam. Negative Homan's sign. No cords or calf tenderness. Calf/Ankle edema is present Labs: Lab Results  Component  Value Date   WBC 8.6 06/02/2015   HGB 7.6* 06/02/2015   HCT 24.5* 06/02/2015   MCV 79.3 06/02/2015   PLT 155 06/02/2015   No flowsheet data found.  Discharge instruction: per After Visit Summary and "Baby and Me Booklet".  After visit meds:    Medication List    TAKE these medications        acetaminophen 325 MG tablet  Commonly known as:  TYLENOL  Take 650-975 mg by mouth every 6 (six) hours as needed for mild pain or headache.     calcium carbonate 500 MG chewable tablet  Commonly known as:  TUMS - dosed in mg elemental calcium  Chew 1 tablet by mouth as needed for indigestion or heartburn. Patient takes 1 tablet 6-8 times daily.     doxylamine (Sleep) 25 MG tablet  Commonly known as:  UNISOM  Take 25 mg by mouth at bedtime as needed for sleep.     ferrous sulfate 325 (65 FE) MG tablet  Take 1 tablet (325 mg total) by mouth 2 (two) times daily with a meal.     ibuprofen 800 MG tablet  Commonly  known as:  ADVIL,MOTRIN  Take 1 tablet (800 mg total) by mouth every 6 (six) hours as needed for mild pain or cramping.     magnesium oxide 400 (241.3 Mg) MG tablet  Commonly known as:  MAG-OX  Take 1 tablet (400 mg total) by mouth daily.     oxyCODONE-acetaminophen 5-325 MG tablet  Commonly known as:  PERCOCET/ROXICET  Take 2 tablets by mouth every 4 (four) hours as needed for moderate pain or severe pain.     prenatal multivitamin Tabs tablet  Take 1 tablet by mouth daily at 12 noon.        Diet: routine diet  Activity: Advance as tolerated. Pelvic rest for 6 weeks.   Outpatient follow up:6 weeks Follow up Appt:No future appointments. Follow up Visit:No Follow-up on file.  Postpartum contraception: Undecided  Newborn Data: Live born female on 06/01/2015 Birth Weight: 10 lb 14.3 oz (4941 g) APGAR: 8, 8  Baby Feeding: Bottle and Breast Disposition:NICU   06/04/2015 [name], Davy Westmoreland, Judie PetitM, CNM

## 2015-06-04 NOTE — Discharge Instructions (Signed)
Breast Pumping Tips °If you are breastfeeding, there may be times when you cannot feed your baby directly. Returning to work or going on a trip are common examples. Pumping allows you to store breast milk and feed it to your baby later.  °You may not get much milk when you first start to pump. Your breasts should start to make more after a few days. If you pump at the times you usually feed your baby, you may be able to keep making enough milk to feed your baby without also using formula. The more often you pump, the more milk you will produce.  °WHEN SHOULD I PUMP?  °· You can begin to pump soon after delivery. However, some experts recommend waiting about 4 weeks before giving your infant a bottle to make sure breastfeeding is going well.  °· If you plan to return to work, begin pumping a few weeks before. This will help you develop techniques that work best for you. It also lets you build up a supply of breast milk.   °· When you are with your infant, feed on demand and pump after each feeding.   °· When you are away from your infant for several hours, pump for about 15 minutes every 2-3 hours. Pump both breasts at the same time if you can.   °· If your infant has a formula feeding, make sure to pump around the same time.     °· If you drink any alcohol, wait 2 hours before pumping.   °HOW DO I PREPARE TO PUMP? °Your let-down reflex is the natural reaction to stimulation that makes your breast milk flow. It is easier to stimulate this reflex when you are relaxed. Find relaxation techniques that work for you. If you have difficulty with your let-down reflex, try these methods:  °· Smell one of your infant's blankets or an item of clothing.   °· Look at a picture or video of your infant.   °· Sit in a quiet, private space.   °· Massage the breast you plan to pump.   °· Place soothing warmth on the breast.   °· Play relaxing music.   °WHAT ARE SOME GENERAL BREAST PUMPING TIPS? °· Wash your hands before you pump. You  do not need to wash your nipples or breasts. °· There are three ways to pump. °· You can use your hand to massage and compress your breast. °· You can use a handheld manual pump. °· You can use an electric pump.   °· Make sure the suction cup (flange) on the breast pump is the right size. Place the flange directly over the nipple. If it is the wrong size or placed the wrong way, it may be painful and cause nipple damage.   °· If pumping is uncomfortable, apply a small amount of purified or modified lanolin to your nipple and areola. °· If you are using an electric pump, adjust the speed and suction power to be more comfortable. °· If pumping is painful or if you are not getting very much milk, you may need a different type of pump. A lactation consultant can help you determine what type of pump to use.   °· Keep a full water bottle near you at all times. Drinking lots of fluid helps you make more milk.  °· You can store your milk to use later. Pumped breast milk can be stored in a sealable, sterile container or plastic bag. Label all stored breast milk with the date you pumped it. °· Milk can stay out at room temperature for up to 8 hours. °·   You can store your milk in the refrigerator for up to 8 days. °· You can store your milk in the freezer for 3 months. Thaw frozen milk using warm water. Do not put it in the microwave. °· Do not smoke. Smoking can lower your milk supply and harm your infant. If you need help quitting, ask your health care provider to recommend a program.   °WHEN SHOULD I CALL MY HEALTH CARE PROVIDER OR A LACTATION CONSULTANT? °· You are having trouble pumping. °· You are concerned that you are not making enough milk. °· You have nipple pain, soreness, or redness. °· You want to use birth control. Birth control pills may lower your milk supply. Talk to your health care provider about your options. °  °This information is not intended to replace advice given to you by your health care provider.  Make sure you discuss any questions you have with your health care provider. °  °Document Released: 06/14/2009 Document Revised: 12/30/2012 Document Reviewed: 10/17/2012 °Elsevier Interactive Patient Education ©2016 [employer] ° °Breastfeeding °Deciding to breastfeed is one of the best choices you can make for you and your baby. A change in hormones during pregnancy causes your breast tissue to grow and increases the number and size of your milk ducts. These hormones also allow proteins, sugars, and fats from your blood supply to make breast milk in your milk-producing glands. Hormones prevent breast milk from being released before your baby is born as well as prompt milk flow after birth. Once breastfeeding has begun, thoughts of your baby, as well as his or her sucking or crying, can stimulate the release of milk from your milk-producing glands.  °BENEFITS OF BREASTFEEDING °For Your Baby °· Your first milk (colostrum) helps your baby's digestive system function better. °· There are antibodies in your milk that help your baby fight off infections. °· Your baby has a lower incidence of asthma, allergies, and sudden infant death syndrome. °· The nutrients in breast milk are better for your baby than infant formulas and are designed uniquely for your baby's needs. °· Breast milk improves your baby's brain development. °· Your baby is less likely to develop other conditions, such as childhood obesity, asthma, or type 2 diabetes mellitus. °For You °· Breastfeeding helps to create a very special bond between you and your baby. °· Breastfeeding is convenient. Breast milk is always available at the correct temperature and costs nothing. °· Breastfeeding helps to burn calories and helps you lose the weight gained during pregnancy. °· Breastfeeding makes your uterus contract to its prepregnancy size faster and slows bleeding (lochia) after you give birth.   °· Breastfeeding helps to lower your risk of developing type 2  diabetes mellitus, osteoporosis, and breast or ovarian cancer later in life. °SIGNS THAT YOUR BABY IS HUNGRY °Early Signs of Hunger °· Increased alertness or activity. °· Stretching. °· Movement of the head from side to side. °· Movement of the head and opening of the mouth when the corner of the mouth or cheek is stroked (rooting). °· Increased sucking sounds, smacking lips, cooing, sighing, or squeaking. °· Hand-to-mouth movements. °· Increased sucking of fingers or hands. °Late Signs of Hunger °· Fussing. °· Intermittent crying. °Extreme Signs of Hunger °Signs of extreme hunger will require calming and consoling before your baby will be able to breastfeed successfully. Do not wait for the following signs of extreme hunger to occur before you initiate breastfeeding: °· Restlessness. °· A loud, strong cry. °· Screaming. °BREASTFEEDING BASICS °Breastfeeding   Initiation °· Find a comfortable place to sit or lie down, with your neck and back well supported. °· Place a pillow or rolled up blanket under your baby to bring him or her to the level of your breast (if you are seated). Nursing pillows are specially designed to help support your arms and your baby while you breastfeed. °· Make sure that your baby's abdomen is facing your abdomen. °· Gently massage your breast. With your fingertips, massage from your chest wall toward your nipple in a circular motion. This encourages milk flow. You may need to continue this action during the feeding if your milk flows slowly. °· Support your breast with 4 fingers underneath and your thumb above your nipple. Make sure your fingers are well away from your nipple and your baby's mouth. °· Stroke your baby's lips gently with your finger or nipple. °· When your baby's mouth is open wide enough, quickly bring your baby to your breast, placing your entire nipple and as much of the colored area around your nipple (areola) as possible into your baby's mouth. °· More areola should be  visible above your baby's upper lip than below the lower lip. °· Your baby's tongue should be between his or her lower gum and your breast. °· Ensure that your baby's mouth is correctly positioned around your nipple (latched). Your baby's lips should create a seal on your breast and be turned out (everted). °· It is common for your baby to suck about 2-3 minutes in order to start the flow of breast milk. °Latching °Teaching your baby how to latch on to your breast properly is very important. An improper latch can cause nipple pain and decreased milk supply for you and poor weight gain in your baby. Also, if your baby is not latched onto your nipple properly, he or she may swallow some air during feeding. This can make your baby fussy. Burping your baby when you switch breasts during the feeding can help to get rid of the air. However, teaching your baby to latch on properly is still the best way to prevent fussiness from swallowing air while breastfeeding. °Signs that your baby has successfully latched on to your nipple: °· Silent tugging or silent sucking, without causing you pain. °· Swallowing heard between every 3-4 sucks. °· Muscle movement above and in front of his or her ears while sucking. °Signs that your baby has not successfully latched on to nipple: °· Sucking sounds or smacking sounds from your baby while breastfeeding. °· Nipple pain. °If you think your baby has not latched on correctly, slip your finger into the corner of your baby's mouth to break the suction and place it between your baby's gums. Attempt breastfeeding initiation again. °Signs of Successful Breastfeeding °Signs from your baby: °· A gradual decrease in the number of sucks or complete cessation of sucking. °· Falling asleep. °· Relaxation of his or her body. °· Retention of a small amount of milk in his or her mouth. °· Letting go of your breast by himself or herself. °Signs from you: °· Breasts that have increased in firmness, weight,  and size 1-3 hours after feeding. °· Breasts that are softer immediately after breastfeeding. °· Increased milk volume, as well as a change in milk consistency and color by the fifth day of breastfeeding. °· Nipples that are not sore, cracked, or bleeding. °Signs That Your Baby is Getting Enough Milk °· Wetting at least 3 diapers in a 24-hour period. The urine should   be clear and pale yellow by age 5 days. °· At least 3 stools in a 24-hour period by age 5 days. The stool should be soft and yellow. °· At least 3 stools in a 24-hour period by age 7 days. The stool should be seedy and yellow. °· No loss of weight greater than 10% of birth weight during the first 3 days of age. °· Average weight gain of 4-7 ounces (113-198 g) per week after age 4 days. °· Consistent daily weight gain by age 5 days, without weight loss after the age of 2 weeks. °After a feeding, your baby may spit up a small amount. This is common. °BREASTFEEDING FREQUENCY AND DURATION °Frequent feeding will help you make more milk and can prevent sore nipples and breast engorgement. Breastfeed when you feel the need to reduce the fullness of your breasts or when your baby shows signs of hunger. This is called "breastfeeding on demand." Avoid introducing a pacifier to your baby while you are working to establish breastfeeding (the first 4-6 weeks after your baby is born). After this time you may choose to use a pacifier. Research has shown that pacifier use during the first year of a baby's life decreases the risk of sudden infant death syndrome (SIDS). °Allow your baby to feed on each breast as long as he or she wants. Breastfeed until your baby is finished feeding. When your baby unlatches or falls asleep while feeding from the first breast, offer the second breast. Because newborns are often sleepy in the first few weeks of life, you may need to awaken your baby to get him or her to feed. °Breastfeeding times will vary from baby to baby. However, the  following rules can serve as a guide to help you ensure that your baby is properly fed: °· Newborns (babies 4 weeks of age or younger) may breastfeed every 1-3 hours. °· Newborns should not go longer than 3 hours during the day or 5 hours during the night without breastfeeding. °· You should breastfeed your baby a minimum of 8 times in a 24-hour period until you begin to introduce solid foods to your baby at around 6 months of age. °BREAST MILK PUMPING °Pumping and storing breast milk allows you to ensure that your baby is exclusively fed your breast milk, even at times when you are unable to breastfeed. This is especially important if you are going back to work while you are still breastfeeding or when you are not able to be present during feedings. Your lactation consultant can give you guidelines on how long it is safe to store breast milk. °A breast pump is a machine that allows you to pump milk from your breast into a sterile bottle. The pumped breast milk can then be stored in a refrigerator or freezer. Some breast pumps are operated by hand, while others use electricity. Ask your lactation consultant which type will work best for you. Breast pumps can be purchased, but some hospitals and breastfeeding support groups lease breast pumps on a monthly basis. A lactation consultant can teach you how to hand express breast milk, if you prefer not to use a pump. °CARING FOR YOUR BREASTS WHILE YOU BREASTFEED °Nipples can become dry, cracked, and sore while breastfeeding. The following recommendations can help keep your breasts moisturized and healthy: °· Avoid using soap on your nipples. °· Wear a supportive bra. Although not required, special nursing bras and tank tops are designed to allow access to your breasts for breastfeeding   without taking off your entire bra or top. Avoid wearing underwire-style bras or extremely tight bras. °· Air dry your nipples for 3-4 minutes after each feeding. °· Use only cotton bra  pads to absorb leaked breast milk. Leaking of breast milk between feedings is normal. °· Use lanolin on your nipples after breastfeeding. Lanolin helps to maintain your skin's normal moisture barrier. If you use pure lanolin, you do not need to wash it off before feeding your baby again. Pure lanolin is not toxic to your baby. You may also hand express a few drops of breast milk and gently massage that milk into your nipples and allow the milk to air dry. °In the first few weeks after giving birth, some women experience extremely full breasts (engorgement). Engorgement can make your breasts feel heavy, warm, and tender to the touch. Engorgement peaks within 3-5 days after you give birth. The following recommendations can help ease engorgement: °· Completely empty your breasts while breastfeeding or pumping. You may want to start by applying warm, moist heat (in the shower or with warm water-soaked hand towels) just before feeding or pumping. This increases circulation and helps the milk flow. If your baby does not completely empty your breasts while breastfeeding, pump any extra milk after he or she is finished. °· Wear a snug bra (nursing or regular) or tank top for 1-2 days to signal your body to slightly decrease milk production. °· Apply ice packs to your breasts, unless this is too uncomfortable for you. °· Make sure that your baby is latched on and positioned properly while breastfeeding. °If engorgement persists after 48 hours of following these recommendations, contact your health care provider or a lactation consultant. °OVERALL HEALTH CARE RECOMMENDATIONS WHILE BREASTFEEDING °· Eat healthy foods. Alternate between meals and snacks, eating 3 of each per day. Because what you eat affects your breast milk, some of the foods may make your baby more irritable than usual. Avoid eating these foods if you are sure that they are negatively affecting your baby. °· Drink milk, fruit juice, and water to satisfy your  thirst (about 10 glasses a day). °· Rest often, relax, and continue to take your prenatal vitamins to prevent fatigue, stress, and anemia. °· Continue breast self-awareness checks. °· Avoid chewing and smoking tobacco. Chemicals from cigarettes that pass into breast milk and exposure to secondhand smoke may harm your baby. °· Avoid alcohol and drug use, including marijuana. °Some medicines that may be harmful to your baby can pass through breast milk. It is important to ask your health care provider before taking any medicine, including all over-the-counter and prescription medicine as well as vitamin and herbal supplements. °It is possible to become pregnant while breastfeeding. If birth control is desired, ask your health care provider about options that will be safe for your baby. °SEEK MEDICAL CARE IF: °· You feel like you want to stop breastfeeding or have become frustrated with breastfeeding. °· You have painful breasts or nipples. °· Your nipples are cracked or bleeding. °· Your breasts are red, tender, or warm. °· You have a swollen area on either breast. °· You have a fever or chills. °· You have nausea or vomiting. °· You have drainage other than breast milk from your nipples. °· Your breasts do not become full before feedings by the fifth day after you give birth. °· You feel sad and depressed. °· Your baby is too sleepy to eat well. °· Your baby is having trouble sleeping.   °· Your   baby is wetting less than 3 diapers in a 24-hour period. °· Your baby has less than 3 stools in a 24-hour period. °· Your baby's skin or the white part of his or her eyes becomes yellow.   °· Your baby is not gaining weight by 5 days of age. °SEEK IMMEDIATE MEDICAL CARE IF: °· Your baby is overly tired (lethargic) and does not want to wake up and feed. °· Your baby develops an unexplained fever. °  °This information is not intended to replace advice given to you by your health care provider. Make sure you discuss any  questions you have with your health care provider. °  °Document Released: 12/25/2004 Document Revised: 09/15/2014 Document Reviewed: 06/18/2012 °[employer]vier Interactive Patient Education ©2016 [employer] °Postpartum Depression and Baby Blues °The postpartum period begins right after the birth of a baby. During this time, there is often a great amount of joy and excitement. It is also a time of many changes in the life of the parents. Regardless of how many times a mother gives birth, each child brings new challenges and dynamics to the family. It is not unusual to have feelings of excitement along with confusing shifts in moods, emotions, and thoughts. All mothers are at risk of developing postpartum depression or the "baby blues." These mood changes can occur right after giving birth, or they may occur many months after giving birth. The baby blues or postpartum depression can be mild or severe. Additionally, postpartum depression can go away rather quickly, or it can be a long-term condition.  °CAUSES °Raised hormone levels and the rapid drop in those levels are thought to be a main cause of postpartum depression and the baby blues. A number of hormones change during and after pregnancy. Estrogen and progesterone usually decrease right after the delivery of your baby. The levels of thyroid hormone and various cortisol steroids also rapidly drop. Other factors that play a role in these mood changes include major life events and genetics.  °RISK FACTORS °If you have any of the following risks for the baby blues or postpartum depression, know what symptoms to watch out for during the postpartum period. Risk factors that may increase the likelihood of getting the baby blues or postpartum depression include: °· Having a personal or family history of depression.   °· Having depression while being pregnant.   °· Having premenstrual mood issues or mood issues related to oral contraceptives. °· Having a lot of life stress.    °· Having marital conflict.   °· Lacking a social support network.   °· Having a baby with special needs.   °· Having health problems, such as diabetes.   °SIGNS AND SYMPTOMS °Symptoms of baby blues include: °· Brief changes in mood, such as going from extreme happiness to sadness. °· Decreased concentration.   °· Difficulty sleeping.   °· Crying spells, tearfulness.   °· Irritability.   °· Anxiety.   °Symptoms of postpartum depression typically begin within the first month after giving birth. These symptoms include: °· Difficulty sleeping or excessive sleepiness.   °· Marked weight loss.   °· Agitation.   °· Feelings of worthlessness.   °· Lack of interest in activity or food.   °Postpartum psychosis is a very serious condition and can be dangerous. Fortunately, it is rare. Displaying any of the following symptoms is cause for immediate medical attention. Symptoms of postpartum psychosis include:  °· Hallucinations and delusions.   °· Bizarre or disorganized behavior.   °· Confusion or disorientation.   °DIAGNOSIS  °A diagnosis is made by an evaluation of your symptoms. There are   no medical or lab tests that lead to a diagnosis, but there are various questionnaires that a health care provider may use to identify those with the baby blues, postpartum depression, or psychosis. Often, a screening tool called the Edinburgh Postnatal Depression Scale is used to diagnose depression in the postpartum period.  °TREATMENT °The baby blues usually goes away on its own in 1-2 weeks. Social support is often all that is needed. You will be encouraged to get adequate sleep and rest. Occasionally, you may be given medicines to help you sleep.  °Postpartum depression requires treatment because it can last several months or longer if it is not treated. Treatment may include individual or group therapy, medicine, or both to address any social, physiological, and psychological factors that may play a role in the depression. Regular  exercise, a healthy diet, rest, and social support may also be strongly recommended.  °Postpartum psychosis is more serious and needs treatment right away. Hospitalization is often needed. °HOME CARE INSTRUCTIONS °· Get as much rest as you can. Nap when the baby sleeps.   °· Exercise regularly. Some women find yoga and walking to be beneficial.   °· Eat a balanced and nourishing diet.   °· Do little things that you enjoy. Have a cup of tea, take a bubble bath, read your favorite magazine, or listen to your favorite music. °· Avoid alcohol.   °· Ask for help with household chores, cooking, grocery shopping, or running errands as needed. Do not try to do everything.   °· Talk to people close to you about how you are feeling. Get support from your partner, family members, friends, or other new moms. °· Try to stay positive in how you think. Think about the things you are grateful for.   °· Do not spend a lot of time alone.   °· Only take over-the-counter or prescription medicine as directed by your health care provider. °· Keep all your postpartum appointments.   °· Let your health care provider know if you have any concerns.   °SEEK MEDICAL CARE IF: °You are having a reaction to or problems with your medicine. °SEEK IMMEDIATE MEDICAL CARE IF: °· You have suicidal feelings.   °· You think you may harm the baby or someone else. °MAKE SURE YOU: °· Understand these instructions. °· Will watch your condition. °· Will get help right away if you are not doing well or get worse. °  °This information is not intended to replace advice given to you by your health care provider. Make sure you discuss any questions you have with your health care provider. °  °Document Released: 09/29/2003 Document Revised: 12/30/2012 Document Reviewed: 10/06/2012 °[employer] Interactive Patient Education ©2016 [employer] °Postpartum Care After Cesarean Delivery °After you deliver your newborn (postpartum period), the usual stay in the hospital is  24-72 hours. If there were problems with your labor or delivery, or if you have other medical problems, you might be in the hospital longer.  °While you are in the hospital, you will receive help and instructions on how to care for yourself and your newborn during the postpartum period.  °While you are in the hospital: °· It is normal for you to have pain or discomfort from the incision in your abdomen. Be sure to tell your nurses when you are having pain, where the pain is located, and what makes the pain worse. °· If you are breastfeeding, you may feel uncomfortable contractions of your uterus for a couple of weeks. This is normal. The contractions help your uterus   get back to normal size. °· It is normal to have some bleeding after delivery. °· For the first 1-3 days after delivery, the flow is red and the amount may be similar to a period. °· It is common for the flow to start and stop. °· In the first few days, you may pass some small clots. Let your nurses know if you begin to pass large clots or your flow increases. °· Do not  flush blood clots down the toilet before having the nurse look at them. °· During the next 3-10 days after delivery, your flow should become more watery and pink or brown-tinged in color. °· Ten to fourteen days after delivery, your flow should be a small amount of yellowish-white discharge. °· The amount of your flow will decrease over the first few weeks after delivery. Your flow may stop in 6-8 weeks. Most women have had their flow stop by 12 weeks after delivery. °· You should change your sanitary pads frequently. °· Wash your hands thoroughly with soap and water for at least 20 seconds after changing pads, using the toilet, or before holding or feeding your newborn. °· Your intravenous (IV) tubing will be removed when you are drinking enough fluids. °· The urine drainage tube (urinary catheter) that was inserted before delivery may be removed within 6-8 hours after delivery or when  feeling returns to your legs. You should feel like you need to empty your bladder within the first 6-8 hours after the catheter has been removed. °· In case you become weak, lightheaded, or faint, call your nurse before you get out of bed for the first time and before you take a shower for the first time. °· Within the first few days after delivery, your breasts may begin to feel tender and full. This is called engorgement. Breast tenderness usually goes away within 48-72 hours after engorgement occurs. You may also notice milk leaking from your breasts. If you are not breastfeeding, do not stimulate your breasts. Breast stimulation can make your breasts produce more milk. °· Spending as much time as possible with your newborn is very important. During this time, you and your newborn can feel close and get to know each other. Having your newborn stay in your room (rooming in) will help to strengthen the bond with your newborn. It will give you time to get to know your newborn and become comfortable caring for your newborn. °· Your hormones change after delivery. Sometimes the hormone changes can temporarily cause you to feel sad or tearful. These feelings should not last more than a few days. If these feelings last longer than that, you should talk to your caregiver. °· If desired, talk to your caregiver about methods of family planning or contraception. °· Talk to your caregiver about immunizations. Your caregiver may want you to have the following immunizations before leaving the hospital: °· Tetanus, diphtheria, and pertussis (Tdap) or tetanus and diphtheria (Td) immunization. It is very important that you and your family (including grandparents) or others caring for your newborn are up-to-date with the Tdap or Td immunizations. The Tdap or Td immunization can help protect your newborn from getting ill. °· Rubella immunization. °· Varicella (chickenpox) immunization. °· Influenza immunization. You should receive  this annual immunization if you did not receive the immunization during your pregnancy. °  °This information is not intended to replace advice given to you by your health care provider. Make sure you discuss any questions you have with your health   care provider. °  °Document Released: 09/19/2011 Document Reviewed: 09/19/2011 °Elsevier Interactive Patient Education ©2016 [employer] °Breastfeeding and Mastitis °Mastitis is inflammation of the breast tissue. It can occur in women who are breastfeeding. This can make breastfeeding painful. Mastitis will sometimes go away on its own. Your health care provider will help determine if treatment is needed. °CAUSES °Mastitis is often associated with a blocked milk (lactiferous) duct. This can happen when too much milk builds up in the breast. Causes of excess milk in the breast can include: °· Poor latch-on. If your baby is not latched onto the breast properly, she or he may not empty your breast completely while breastfeeding. °· Allowing too much time to pass between feedings. °· Wearing a bra or other clothing that is too tight. This puts extra pressure on the lactiferous ducts so milk does not flow through them as it should. °Mastitis can also be caused by a bacterial infection. Bacteria may enter the breast tissue through cuts or openings in the skin. In women who are breastfeeding, this may occur because of cracked or irritated skin. Cracks in the skin are often caused when your baby does not latch on properly to the breast. °SIGNS AND SYMPTOMS °· Swelling, redness, tenderness, and pain in an area of the breast. °· Swelling of the glands under the arm on the same side. °· Fever may or may not accompany mastitis. °If an infection is allowed to progress, a collection of pus (abscess) may develop. °DIAGNOSIS  °Your health care provider can usually diagnose mastitis based on your symptoms and a physical exam. Tests may be done to help confirm the diagnosis. These may  include: °· Removal of pus from the breast by applying pressure to the area. This pus can be examined in the lab to determine which bacteria are present. If an abscess has developed, the fluid in the abscess can be removed with a needle. This can also be used to confirm the diagnosis and determine the bacteria present. In most cases, pus will not be present. °· Blood tests to determine if your body is fighting a bacterial infection. °· Mammogram or ultrasound tests to rule out other problems or diseases. °TREATMENT  °Mastitis that occurs with breastfeeding will sometimes go away on its own. Your health care provider may choose to wait 24 hours after first seeing you to decide whether a prescription medicine is needed. If your symptoms are worse after 24 hours, your health care provider will likely prescribe an antibiotic medicine to treat the mastitis. He or she will determine which bacteria are most likely causing the infection and will then select an appropriate antibiotic medicine. This is sometimes changed based on the results of tests performed to identify the bacteria, or if there is no response to the antibiotic medicine selected. Antibiotic medicines are usually given by mouth. You may also be given medicine for pain. °HOME CARE INSTRUCTIONS °· Only take over-the-counter or prescription medicines for pain, fever, or discomfort as directed by your health care provider. °· If your health care provider prescribed an antibiotic medicine, take the medicine as directed. Make sure you finish it even if you start to feel better. °· Do not wear a tight or underwire bra. Wear a soft, supportive bra. °· Increase your fluid intake, especially if you have a fever. °· Continue to empty the breast. Your health care provider can tell you whether this milk is safe for your infant or needs to be thrown out. You may   be told to stop nursing until your health care provider thinks it is safe for your baby. Use a breast pump if  you are advised to stop nursing. °· Keep your nipples clean and dry. °· Empty the first breast completely before going to the other breast. If your baby is not emptying your breasts completely for some reason, use a breast pump to empty your breasts. °· If you go back to work, pump your breasts while at work to stay in time with your nursing schedule. °· Avoid allowing your breasts to become overly filled with milk (engorged). °SEEK MEDICAL CARE IF: °· You have pus-like discharge from the breast. °· Your symptoms do not improve with the treatment prescribed by your health care provider within 2 days. °SEEK IMMEDIATE MEDICAL CARE IF: °· Your pain and swelling are getting worse. °· You have pain that is not controlled with medicine. °· You have a red line extending from the breast toward your armpit. °· You have a fever or persistent symptoms for more than 2-3 days. °· You have a fever and your symptoms suddenly get worse. °MAKE SURE YOU:  °· Understand these instructions. °· Will watch your condition. °· Will get help right away if you are not doing well or get worse. °  °This information is not intended to replace advice given to you by your health care provider. Make sure you discuss any questions you have with your health care provider. °  °Document Released: 04/21/2004 Document Revised: 12/30/2012 Document Reviewed: 07/31/2012 °Elsevier Interactive Patient Education ©2016 Elsevier Inc. ° °

## 2015-06-04 NOTE — Progress Notes (Signed)
Patient ID: Sherry [name], female   DOB: 08/20/1985, 30 y.o.   MRN: [id number] Subjective: S/P Primary Cesarean Delivery for Polyhydramnios / Macrosomia POD# 3 Information for the patient's newborn:  Sherry [name], Boy Tahliyah [[id number]][[id number]]  female   / circ - planning prior to d/c; in NICU  Reports feeling well. Feeding: breast & bottle in NICU Patient reports tolerating PO.  Breast symptoms: pumping without difficulty Pain minimally controlled with ibuprofen (OTC) and Percocet - "not getting the medication when sever pain first starts" Denies HA/SOB/C/P/N/V/dizziness. Flatus present. No BM. She reports vaginal bleeding as normal, without clots.  She is ambulating, urinating without difficult.     Objective:   VS:  Filed Vitals:   06/03/15 0553 06/03/15 1225 06/03/15 1838 06/03/15 2120  BP: 122/62 126/68 122/65 139/79  Pulse: 81 91 81 91  Temp: 98.6 F (37 C) 98.7 F (37.1 C) 98.2 F (36.8 C) 97.9 F (36.6 C)  TempSrc: Oral Oral Oral Oral  Resp: 18 18 18 18   SpO2: 100% 100% 100% 100%     No intake or output data in the 24 hours ending 06/04/15 0835       Recent Labs  06/02/15 0517  WBC 8.6  HGB 7.6*  HCT 24.5*  PLT 155     Blood type: --/--/O POS (05/23 1150)  Rubella: Immune (10/24 0000)     Physical Exam:   General: alert, cooperative and no distress  Abdomen: soft, nontender, normal bowel sounds  Incision: clean, dry, intact and skin well-approximated with sutures  Uterine Fundus: firm, 2 FB below umbilicus, nontender  Lochia: minimal  Ext: extremities normal, atraumatic, no cyanosis or edema, Homans sign is negative, no sign of DVT and no edema, redness or tenderness in the calves or thighs   Assessment/Plan: 30 y.o.   POD# 3.  S/P Cesarean Delivery.  Indications: polyhydramnios / macrosomia                Principal Problem:   Postpartum care following cesarean delivery (5/24) Active Problems:   Postoperative state  Doing well, stable.                Regular diet as tolerated Continue Magnesium Oxide 400 mg daily Take Percocet 2 tablets until pain better managed, then decrease to 1 tablet Increase Ibuorofen to 800 mg every 8 hrs Ambulate Routine post-op care D/C home today  Sherry [name], Bryna Razavi, M, MSN, CNM 06/04/2015, 8:35 AM

## 2015-06-04 NOTE — Progress Notes (Signed)
D/c instructions and prescriptions reviewed with pt. Pt is being d/c but infant will room in with mom tonight. No questions at this time. Pt will call for f/u appt.

## 2015-06-04 NOTE — Lactation Note (Signed)
This note was copied from a baby's chart. Lactation Consultation Note  Patient Name: Sherry [name] WUJWJ'XToday's Date: 06/04/2015 Reason for consult: Follow-up assessment;NICU baby Infant is 9069 hours old & [facility] for follow-up assessment. Mom reports that she BF for ~30 mins on one breast at 5:45am and that things are going well with no breast/ nipple pain. Mom reports she has been pumping & gets ~3630mL total from both breasts. Mom stated they are planning on going home today & have been told that baby may be discharged from NICU as well. Mom & FOB asked many questions about milk storage at home, how to warm breastmilk, treatment of mastitis, mom's fluid intake; LC discussed these with them & also discussed prevention and treatment of engorgement. Reminded mom of LC number once she is home as well as support groups. Mom stated she has a few DEBP at home and has a lot of support. Encouraged mom to BF on cue once home & pump if full after feeding or if baby gets a bottle instead of breast with goal of BF 8-12 x in 24hrs. Mom & FOB report no further questions or concerns at this time.   Maternal Data    Feeding    LATCH Score/Interventions                      Lactation Tools Discussed/Used     Consult Status Consult Status: Complete    Sherry GroutLaura C Arien [name] 06/04/2015, 11:42 AM

## 2015-06-10 ENCOUNTER — Inpatient Hospital Stay (HOSPITAL_COMMUNITY): Payer: BLUE CROSS/BLUE SHIELD | Admitting: Anesthesiology

## 2015-06-10 ENCOUNTER — Inpatient Hospital Stay (HOSPITAL_COMMUNITY)
Admission: AD | Admit: 2015-06-10 | Discharge: 2015-06-13 | DRG: 853 | Disposition: A | Discharge status: Home / Self Care | Payer: BLUE CROSS/BLUE SHIELD | Source: Ambulatory Visit | Attending: Surgery | Admitting: Surgery

## 2015-06-10 ENCOUNTER — Other Ambulatory Visit: Payer: Self-pay

## 2015-06-10 ENCOUNTER — Inpatient Hospital Stay (HOSPITAL_COMMUNITY): Payer: BLUE CROSS/BLUE SHIELD

## 2015-06-10 ENCOUNTER — Encounter (HOSPITAL_COMMUNITY): Admission: AD | Disposition: A | Discharge status: Home / Self Care | Payer: Self-pay | Source: Ambulatory Visit

## 2015-06-10 ENCOUNTER — Encounter (HOSPITAL_COMMUNITY): Payer: Self-pay

## 2015-06-10 DIAGNOSIS — Z79899 Other long term (current) drug therapy: Secondary | ICD-10-CM

## 2015-06-10 DIAGNOSIS — O9963 Diseases of the digestive system complicating the puerperium: Secondary | ICD-10-CM | POA: Diagnosis present

## 2015-06-10 DIAGNOSIS — F419 Anxiety disorder, unspecified: Secondary | ICD-10-CM | POA: Diagnosis present

## 2015-06-10 DIAGNOSIS — R000 Tachycardia, unspecified: Secondary | ICD-10-CM | POA: Diagnosis present

## 2015-06-10 DIAGNOSIS — A419 Sepsis, unspecified organism: Principal | ICD-10-CM | POA: Diagnosis present

## 2015-06-10 DIAGNOSIS — K353 Acute appendicitis with localized peritonitis: Secondary | ICD-10-CM | POA: Diagnosis present

## 2015-06-10 DIAGNOSIS — K3532 Acute appendicitis with perforation and localized peritonitis, without abscess: Secondary | ICD-10-CM

## 2015-06-10 DIAGNOSIS — Z818 Family history of other mental and behavioral disorders: Secondary | ICD-10-CM

## 2015-06-10 DIAGNOSIS — R1031 Right lower quadrant pain: Secondary | ICD-10-CM | POA: Diagnosis present

## 2015-06-10 DIAGNOSIS — K3580 Unspecified acute appendicitis: Secondary | ICD-10-CM | POA: Diagnosis present

## 2015-06-10 DIAGNOSIS — K3533 Acute appendicitis with perforation and localized peritonitis, with abscess: Secondary | ICD-10-CM | POA: Diagnosis present

## 2015-06-10 HISTORY — PX: LAPAROSCOPIC APPENDECTOMY: SHX408

## 2015-06-10 LAB — COMPREHENSIVE METABOLIC PANEL
ALBUMIN: 2.9 g/dL — AB (ref 3.5–5.0)
ALT: 15 U/L (ref 14–54)
ANION GAP: 10 (ref 5–15)
AST: 22 U/L (ref 15–41)
Alkaline Phosphatase: 206 U/L — ABNORMAL HIGH (ref 38–126)
BILIRUBIN TOTAL: 1.5 mg/dL — AB (ref 0.3–1.2)
BUN: 10 mg/dL (ref 6–20)
CO2: 21 mmol/L — ABNORMAL LOW (ref 22–32)
Calcium: 8 mg/dL — ABNORMAL LOW (ref 8.9–10.3)
Chloride: 105 mmol/L (ref 101–111)
Creatinine, Ser: 1.05 mg/dL — ABNORMAL HIGH (ref 0.44–1.00)
GFR calc Af Amer: >60 mL/min (ref >60)
GFR calc non Af Amer: >60 mL/min (ref >60)
GLUCOSE: 85 mg/dL (ref 65–99)
POTASSIUM: 3.4 mmol/L — AB (ref 3.5–5.1)
SODIUM: 136 mmol/L (ref 135–145)
Total Protein: 5.9 g/dL — ABNORMAL LOW (ref 6.5–8.1)

## 2015-06-10 LAB — URINALYSIS, ROUTINE W REFLEX MICROSCOPIC
Bilirubin Urine: NEGATIVE
Glucose, UA: NEGATIVE mg/dL
HGB URINE DIPSTICK: NEGATIVE
Ketones, ur: NEGATIVE mg/dL
LEUKOCYTES UA: NEGATIVE
NITRITE: NEGATIVE
PROTEIN: NEGATIVE mg/dL
Specific Gravity, Urine: 1.01 (ref 1.005–1.030)
pH: 6 (ref 5.0–8.0)

## 2015-06-10 LAB — TYPE AND SCREEN
ABO/RH(D): O POS
Antibody Screen: NEGATIVE

## 2015-06-10 LAB — CBC WITH DIFFERENTIAL/PLATELET
BASOS ABS: 0 10*3/uL (ref 0.0–0.1)
BASOS PCT: 0 %
EOS ABS: 0 10*3/uL (ref 0.0–0.7)
EOS PCT: 0 %
HCT: 34.9 % — ABNORMAL LOW (ref 36.0–46.0)
Hemoglobin: 10.5 g/dL — ABNORMAL LOW (ref 12.0–15.0)
Lymphocytes Relative: 7 %
Lymphs Abs: 0.2 10*3/uL — ABNORMAL LOW (ref 0.7–4.0)
MCH: 24.5 pg — ABNORMAL LOW (ref 26.0–34.0)
MCHC: 30.1 g/dL (ref 30.0–36.0)
MCV: 81.5 fL (ref 78.0–100.0)
Monocytes Absolute: 0 10*3/uL — ABNORMAL LOW (ref 0.1–1.0)
Monocytes Relative: 0 %
NEUTROS PCT: 93 %
Neutro Abs: 2.2 10*3/uL (ref 1.7–7.7)
PLATELETS: 208 10*3/uL (ref 150–400)
RBC: 4.28 MIL/uL (ref 3.87–5.11)
RDW: 17.2 % — ABNORMAL HIGH (ref 11.5–15.5)
WBC: 2.3 10*3/uL — AB (ref 4.0–10.5)

## 2015-06-10 LAB — SURGICAL PCR SCREEN
MRSA, PCR: NEGATIVE
STAPHYLOCOCCUS AUREUS: POSITIVE — AB

## 2015-06-10 LAB — LACTIC ACID, PLASMA
LACTIC ACID, VENOUS: 3.1 mmol/L — AB (ref 0.5–2.0)
LACTIC ACID, VENOUS: 2.6 mmol/L — AB (ref 0.5–2.0)

## 2015-06-10 SURGERY — APPENDECTOMY, LAPAROSCOPIC
Anesthesia: General

## 2015-06-10 MED ORDER — GLYCOPYRROLATE 0.2 MG/ML IJ SOLN
INTRAMUSCULAR | Status: DC | PRN
  Administered 2015-06-10: .6 mg via INTRAVENOUS

## 2015-06-10 MED ORDER — PHENYLEPHRINE HCL 10 MG/ML IJ SOLN
INTRAMUSCULAR | Status: DC | PRN
  Administered 2015-06-10 (×3): 80 ug via INTRAVENOUS
  Administered 2015-06-10: 40 ug via INTRAVENOUS

## 2015-06-10 MED ORDER — ACETAMINOPHEN 325 MG PO TABS
650.0000 mg | ORAL_TABLET | Freq: Once | ORAL | Status: AC
  Administered 2015-06-10: 650 mg via ORAL
  Filled 2015-06-10: qty 2

## 2015-06-10 MED ORDER — FENTANYL CITRATE (PF) 100 MCG/2ML IJ SOLN
50.0000 ug | Freq: Once | INTRAMUSCULAR | Status: AC
  Administered 2015-06-10: 50 ug via INTRAVENOUS
  Filled 2015-06-10: qty 2

## 2015-06-10 MED ORDER — SODIUM CHLORIDE 0.9 % IV BOLUS (SEPSIS): 1000.0000 mL | Freq: Once | INTRAVENOUS | Status: DC

## 2015-06-10 MED ORDER — FENTANYL CITRATE (PF) 250 MCG/5ML IJ SOLN
INTRAMUSCULAR | Status: AC
  Filled 2015-06-10: qty 5

## 2015-06-10 MED ORDER — SUCCINYLCHOLINE CHLORIDE 20 MG/ML IJ SOLN
INTRAMUSCULAR | Status: DC | PRN
  Administered 2015-06-10: 100 mg via INTRAVENOUS

## 2015-06-10 MED ORDER — SODIUM CHLORIDE 0.9 % IV BOLUS (SEPSIS)
1000.0000 mL | Freq: Once | INTRAVENOUS | Status: AC
  Administered 2015-06-10: 1000 mL via INTRAVENOUS

## 2015-06-10 MED ORDER — MEPERIDINE HCL 25 MG/ML IJ SOLN
6.2500 mg | INTRAMUSCULAR | Status: DC | PRN
  Filled 2015-06-10: qty 1

## 2015-06-10 MED ORDER — PROMETHAZINE HCL 25 MG/ML IJ SOLN
6.2500 mg | INTRAMUSCULAR | Status: DC | PRN
Start: 2015-06-10 — End: 2015-06-11
  Filled 2015-06-10: qty 1

## 2015-06-10 MED ORDER — PIPERACILLIN-TAZOBACTAM 3.375 G IVPB
3.3750 g | Freq: Three times a day (TID) | INTRAVENOUS | Status: DC
  Administered 2015-06-10 – 2015-06-13 (×8): 3.375 g via INTRAVENOUS
  Filled 2015-06-10 (×9): qty 50

## 2015-06-10 MED ORDER — LIDOCAINE HCL (CARDIAC) 20 MG/ML IV SOLN
INTRAVENOUS | Status: AC
  Filled 2015-06-10: qty 5

## 2015-06-10 MED ORDER — ROCURONIUM BROMIDE 100 MG/10ML IV SOLN
INTRAVENOUS | Status: DC | PRN
  Administered 2015-06-10: 5 mg via INTRAVENOUS
  Administered 2015-06-10: 25 mg via INTRAVENOUS

## 2015-06-10 MED ORDER — VANCOMYCIN HCL IN DEXTROSE 1-5 GM/200ML-% IV SOLN
1000.0000 mg | Freq: Three times a day (TID) | INTRAVENOUS | Status: DC
  Administered 2015-06-11: 1000 mg via INTRAVENOUS
  Filled 2015-06-10 (×4): qty 200

## 2015-06-10 MED ORDER — SODIUM CHLORIDE 0.9 % IV BOLUS (SEPSIS)
500.0000 mL | Freq: Once | INTRAVENOUS | Status: AC
  Administered 2015-06-10: 500 mL via INTRAVENOUS

## 2015-06-10 MED ORDER — SODIUM CHLORIDE 0.9 % IV SOLN
8.0000 mg | Freq: Once | INTRAVENOUS | Status: AC
  Administered 2015-06-10: 8 mg via INTRAVENOUS
  Filled 2015-06-10: qty 4

## 2015-06-10 MED ORDER — BUPIVACAINE-EPINEPHRINE (PF) 0.25% -1:200000 IJ SOLN
INTRAMUSCULAR | Status: AC
  Filled 2015-06-10: qty 30

## 2015-06-10 MED ORDER — PROPOFOL 10 MG/ML IV BOLUS
INTRAVENOUS | Status: DC | PRN
  Administered 2015-06-10: 140 mg via INTRAVENOUS

## 2015-06-10 MED ORDER — LACTATED RINGERS IR SOLN
Status: DC | PRN
  Administered 2015-06-10: 1000 mL

## 2015-06-10 MED ORDER — ONDANSETRON HCL 4 MG/2ML IJ SOLN
INTRAMUSCULAR | Status: AC
  Filled 2015-06-10: qty 2

## 2015-06-10 MED ORDER — PIPERACILLIN-TAZOBACTAM 3.375 G IVPB
INTRAVENOUS | Status: AC
  Filled 2015-06-10: qty 50

## 2015-06-10 MED ORDER — FENTANYL CITRATE (PF) 100 MCG/2ML IJ SOLN
INTRAMUSCULAR | Status: DC | PRN
  Administered 2015-06-10 (×5): 50 ug via INTRAVENOUS
  Administered 2015-06-10: 100 ug via INTRAVENOUS
  Administered 2015-06-10: 50 ug via INTRAVENOUS

## 2015-06-10 MED ORDER — LACTATED RINGERS IV SOLN
INTRAVENOUS | Status: DC | PRN
  Administered 2015-06-10: 22:00:00 via INTRAVENOUS

## 2015-06-10 MED ORDER — ACETAMINOPHEN 10 MG/ML IV SOLN
INTRAVENOUS | Status: AC
  Filled 2015-06-10: qty 100

## 2015-06-10 MED ORDER — LACTATED RINGERS IV SOLN
INTRAVENOUS | Status: DC
  Administered 2015-06-10: 22:00:00 via INTRAVENOUS
  Filled 2015-06-10: qty 1000

## 2015-06-10 MED ORDER — LIDOCAINE HCL (CARDIAC) 20 MG/ML IV SOLN
INTRAVENOUS | Status: DC | PRN
  Administered 2015-06-10: 80 mg via INTRAVENOUS

## 2015-06-10 MED ORDER — ONDANSETRON HCL 4 MG/2ML IJ SOLN
INTRAMUSCULAR | Status: DC | PRN
  Administered 2015-06-10: 4 mg via INTRAVENOUS

## 2015-06-10 MED ORDER — PIPERACILLIN-TAZOBACTAM 3.375 G IVPB 30 MIN
3.3750 g | Freq: Once | INTRAVENOUS | Status: AC
  Administered 2015-06-10: 3.375 g via INTRAVENOUS
  Filled 2015-06-10: qty 50

## 2015-06-10 MED ORDER — IOPAMIDOL (ISOVUE-300) INJECTION 61%
100.0000 mL | Freq: Once | INTRAVENOUS | Status: AC | PRN
  Administered 2015-06-10: 100 mL via INTRAVENOUS

## 2015-06-10 MED ORDER — VANCOMYCIN HCL IN DEXTROSE 1-5 GM/200ML-% IV SOLN
1000.0000 mg | Freq: Once | INTRAVENOUS | Status: AC
  Administered 2015-06-10: 1000 mg via INTRAVENOUS
  Filled 2015-06-10: qty 200

## 2015-06-10 MED ORDER — DIATRIZOATE MEGLUMINE & SODIUM 66-10 % PO SOLN
30.0000 mL | Freq: Once | ORAL | Status: AC
  Administered 2015-06-10: 30 mL via ORAL
  Filled 2015-06-10: qty 30

## 2015-06-10 MED ORDER — FENTANYL CITRATE (PF) 100 MCG/2ML IJ SOLN
25.0000 ug | INTRAMUSCULAR | Status: DC | PRN
  Administered 2015-06-11 (×2): 50 ug via INTRAVENOUS
  Filled 2015-06-10: qty 1

## 2015-06-10 MED ORDER — LIDOCAINE-PRILOCAINE 2.5-2.5 % EX CREA
TOPICAL_CREAM | Freq: Once | CUTANEOUS | Status: AC
  Administered 2015-06-10: 14:00:00 via TOPICAL
  Filled 2015-06-10: qty 5

## 2015-06-10 MED ORDER — NEOSTIGMINE METHYLSULFATE 10 MG/10ML IV SOLN
INTRAVENOUS | Status: DC | PRN
  Administered 2015-06-10: 4 mg via INTRAVENOUS

## 2015-06-10 MED ORDER — 0.9 % SODIUM CHLORIDE (POUR BTL) OPTIME
TOPICAL | Status: DC | PRN
  Administered 2015-06-10: 1000 mL

## 2015-06-10 MED ORDER — PROPOFOL 10 MG/ML IV BOLUS
INTRAVENOUS | Status: AC
  Filled 2015-06-10: qty 20

## 2015-06-10 MED ORDER — ACETAMINOPHEN 10 MG/ML IV SOLN
1000.0000 mg | Freq: Once | INTRAVENOUS | Status: AC
  Administered 2015-06-10: 1000 mg via INTRAVENOUS
  Filled 2015-06-10: qty 100

## 2015-06-10 SURGICAL SUPPLY — 33 items
APPLIER CLIP ROT 10 11.4 M/L (STAPLE)
BENZOIN TINCTURE PRP APPL 2/3 (GAUZE/BANDAGES/DRESSINGS) IMPLANT
CLIP APPLIE ROT 10 11.4 M/L (STAPLE) IMPLANT
COVER SURGICAL LIGHT HANDLE (MISCELLANEOUS) IMPLANT
CUTTER FLEX LINEAR 45M (STAPLE) ×2 IMPLANT
DECANTER SPIKE VIAL GLASS SM (MISCELLANEOUS) ×2 IMPLANT
DRAIN CHANNEL 19F RND (DRAIN) ×2 IMPLANT
DRAPE LAPAROSCOPIC ABDOMINAL (DRAPES) IMPLANT
ELECT REM PT RETURN 9FT ADLT (ELECTROSURGICAL) ×2
ELECTRODE REM PT RTRN 9FT ADLT (ELECTROSURGICAL) ×1 IMPLANT
ENDOLOOP SUT PDS II  0 18 (SUTURE)
ENDOLOOP SUT PDS II 0 18 (SUTURE) IMPLANT
EVACUATOR SILICONE 100CC (DRAIN) ×2 IMPLANT
GLOVE SURG ORTHO 8.0 STRL STRW (GLOVE) ×6 IMPLANT
GOWN STRL REUS W/TWL XL LVL3 (GOWN DISPOSABLE) ×4 IMPLANT
KIT BASIN OR (CUSTOM PROCEDURE TRAY) ×2 IMPLANT
POUCH SPECIMEN RETRIEVAL 10MM (ENDOMECHANICALS) ×2 IMPLANT
RELOAD 45 VASCULAR/THIN (ENDOMECHANICALS) IMPLANT
RELOAD STAPLE TA45 3.5 REG BLU (ENDOMECHANICALS) ×2 IMPLANT
SET IRRIG TUBING LAPAROSCOPIC (IRRIGATION / IRRIGATOR) ×2 IMPLANT
SHEARS HARMONIC ACE PLUS 36CM (ENDOMECHANICALS) ×2 IMPLANT
SPONGE DRAIN TRACH 4X4 STRL 2S (GAUZE/BANDAGES/DRESSINGS) ×2 IMPLANT
STRIP CLOSURE SKIN 1/2X4 (GAUZE/BANDAGES/DRESSINGS) IMPLANT
SUT ETHILON 3 0 PS 1 (SUTURE) ×2 IMPLANT
SUT MNCRL AB 4-0 PS2 18 (SUTURE) ×2 IMPLANT
TOWEL OR 17X26 10 PK STRL BLUE (TOWEL DISPOSABLE) ×2 IMPLANT
TOWEL OR NON WOVEN STRL DISP B (DISPOSABLE) ×2 IMPLANT
TRAY FOLEY W/METER SILVER 14FR (SET/KITS/TRAYS/PACK) IMPLANT
TRAY FOLEY W/METER SILVER 16FR (SET/KITS/TRAYS/PACK) IMPLANT
TRAY LAPAROSCOPIC (CUSTOM PROCEDURE TRAY) ×2 IMPLANT
TROCAR BLADELESS OPT 5 75 (ENDOMECHANICALS) ×2 IMPLANT
TROCAR XCEL BLUNT TIP 100MML (ENDOMECHANICALS) ×2 IMPLANT
TROCAR XCEL NON-BLD 11X100MML (ENDOMECHANICALS) ×2 IMPLANT

## 2015-06-10 NOTE — MAU Provider Note (Addendum)
Subjective: Pain present, no worse. Nausea returned. No chills/ rigors.   Objective: Vital signs in last 24 hours: Temp:  [98.7 F (37.1 C)-103.1 F (39.5 C)] 99.2 F (37.3 C) (06/02 1553) Pulse Rate:  [125-160] 132 (06/02 1731) Resp:  [20-28] 22 (06/02 1645) BP: (97-118)/(56-72) 97/57 mmHg (06/02 1704) SpO2:  [95 %-100 %] 97 % (06/02 1731) Weight:  [232 lb (105.235 kg)] 232 lb (105.235 kg) (06/02 1553) Weight change:     BP stable, pulse improved to 130s (from 140-150), O2 sat 97%   U/op 900 cc, dark.   Appears uncomfortable but with warm skin, not clammy and not diaphoretic. Improved peripheral pulse.  Abdo still RLQ guarding, rebound but no rigidity, other areas area soft.   Lab Results: New Lab- Lactic Acid reviewed, elevated 3 and 1 hr later 2.6    Studies/Results: Ct Abdomen Pelvis W Contrast  06/10/2015  ADDENDUM REPORT: 06/10/2015 17:05 ADDENDUM: Study discussed by telephone with [name] on 06/10/2015 at 17:03 . Electronically Signed   By: Odessa FlemingH  Hall M.D.   On: 06/10/2015 17:05  06/10/2015  CLINICAL DATA:  30 year old female 9 days postpartum status post C-section with right lower quadrant abdominal pain for about 30 hours. Diagnosed with uterine infection and started antibiotics, but pain persists. Initial encounter. EXAM: CT ABDOMEN AND PELVIS WITH CONTRAST TECHNIQUE: Multidetector CT imaging of the abdomen and pelvis was performed using the standard protocol following bolus administration of intravenous contrast. CONTRAST:  100mL ISOVUE-300 IOPAMIDOL (ISOVUE-300) INJECTION 61% COMPARISON:  None. FINDINGS: Mild dependent atelectasis at both lung bases. No pericardial or pleural effusions. No acute osseous abnormality identified. Foley type catheter within the urinary bladder which is largely decompressed. There is a small volume of air in fluid within the bladder. There is a small volume of pelvic free fluid. Mild uterine enlargement compatible with  postpartum state and/or mild endometritis. Decompressed distal colon. Negative sigmoid and descending colon. Mild motion artifact in the mid abdomen. Mild redundancy of the transverse colon which otherwise appears negative. Small volume free fluid along the right liver tip tracking to the gallbladder fossa. Confluent inflammatory stranding in the right lower quadrant with associated retroperitoneal and para renal stranding on the right. Abnormally thickened and indistinct appearance of the appendix as it emerges from the cecum which measures up to 15 mm diameter (series 2, image 67). The appendix then appears to course cephalad with mural hyper enhancement and surrounding phlegmon type changes. There is associated secondary inflammation of the terminal ileum, including a rounded 2-3 cm area of loculated appearing fluid density along the lateral aspect of the TI (series 2, image 63 and coronal image 40). Oral contrast was administered but has not yet reached the distal small bowel. There is no extraluminal gas or pneumoperitoneum identified. Mid in proximal small bowel loops have a more normal appearance. Negative stomach and duodenum. No other free fluid about the liver. Liver enhancement is within normal limits. Aside from the small volume of free fluid tracking to the gallbladder fossa, the gallbladder appears normal. No adjacent fat stranding. Negative spleen, pancreas and adrenal glands. Portal venous system is patent. Major arterial structures in the abdomen and pelvis are patent. Mild reactive appearing right lower quadrant mesentery lymph node prominence. Both kidneys appear within normal limits. C-section scar along the lower ventral abdomen incidentally noted. On delayed images there is symmetric and normal bilateral renal contrast excretion the right ureter is normal to the ureterovesical junction. 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 IMPRESSION: 1. Positive for  acute appendicitis with possible perforation. Right lower quadrant phlegmon and 2-3 cm area of loculated fluid/developing abscess along the terminal ileum. No extraluminal gas or pneumoperitoneum identified. 2. Small volume free fluid about the inferior liver and in the pelvis. Electronically Signed: By: Odessa Fleming M.D. On: 06/10/2015 16:56    Medications: Vancomycin, Zosyn, Zofran, IV fluids.   Assessment/Plan: Post-op C/s day 9. Acute appendicitis and possible rupture. Early sepsis with low WBC and High Lactic acid. S/p fluid resuscitation and antibiotics. Improved overall, so stable for transfer.  D/w Dr Gerrit Friends, on call surgeon for [name] surgery service. Patient CT reviewed. Accepts transfer.  [facility] on way, will transfer to [facility], Rm 1583.  Patient counseled with CT report, reviewed Acute appendicitis with possible rupture and need for surgical intervention. Accepts transfer of care.     Sherry [name] R 06/10/2015, 5:34 PM

## 2015-06-10 NOTE — MAU Note (Signed)
[id number] et[facility].

## 2015-06-10 NOTE — Op Note (Signed)
OPERATIVE REPORT - LAPAROSCOPIC APPENDECTOMY  Preop diagnosis: Acute appendicitis with perforation, sepsis  Postop diagnosis: Same  Procedure: Laparoscopic appendectomy and drain placement  Surgeon:  Velora Hecklerodd M. Lamari Beckles, MD, FACS  Anesthesia: General endotracheal  Estimated blood loss: Minimal  Preparation: Chlora-prep  Complications: None  Indications:  patient is a 30 yo WF who is 8 days post-partum from birth of first child by C-section. RLQ pain after discharge home with baby. Developed fever and chills. Seen by obstetrician Ma Hillock([facility]) and started on Augmentin for suspected endometritis. Mild improvement, then increasing fever, shaking chills. EMS called and patient evaluated and transported to [facility]. WBC 2.3. Lactate elevated. CTA positive for probable acute appendicitis with perforation. General surgery contacted and [facility] and admitted to the general surgery service. Zosyn and Vanco started prior to transfer.  Procedure:  Patient is brought to the operating room and placed in a supine position on the operating room table. Following administration of general anesthesia, a time out was held and the patient's name and procedure is confirmed. Patient is then prepped and draped in the usual strict aseptic fashion.  After ascertaining that an adequate level of anesthesia has been achieved, a peri-umbilical incision is made with a #15 blade. Dissection is carried down to the fascia. Fascia is incised in the midline and the peritoneal cavity is entered cautiously. A #0-vicryl pursestring suture is placed in the fascia. An Hassan cannula is introduced under direct vision and secured with the pursestring suture. The abdomen is insufflated with carbon dioxide. The laparoscope is introduced and the abdomen is explored. Operative ports are placed in the right upper quadrant and left lower quadrant. The appendix is  identified. The appendix is densely adherent to the terminal ileum and lateral cecal wall.  There is perforation in the proximal third of the appendix.  The mesoappendix is divided with the harmonic scalpel near the base of the appendix. The base of the appendix is dissected out clearing the junction with the cecal wall. Using an Endo-GIA stapler, the base of the appendix is transected at the junction with the cecal wall. There is good approximation of tissue along the staple line. There is good hemostasis along the staple line. The remainder of the mesoappendix is divided with the Harmonic scalpel.  The appendix is placed into an endo-catch bag and withdrawn through the umbilical port. The #0-vicryl pursestring suture is tied securely.  Right lower quadrant is irrigated with warm saline which is evacuated. Good hemostasis is noted. A 19Fr Blake drain is placed and exited through the RUQ port site. Ports are removed under direct vision. Good hemostasis is noted at the port sites. Pneumoperitoneum is released.  Drain is secured to the skin with a 3-0 Nylon suture.  Drain is placed to bulb suction.  Skin incisions are anesthetized with local anesthetic. Wounds are closed with interrupted 4-0 Monocryl subcuticular sutures. Wounds are washed and dried and benzoin and Steri-Strips are applied. Dressings are applied. The patient is awakened from anesthesia and brought to the recovery room. The patient tolerated the procedure well.  Velora Hecklerodd M. Brennan Karam, MD, [facility] Office: [phone]

## 2015-06-10 NOTE — MAU Note (Addendum)
Was diagnosed with a uterine infection was put on an antibiotic (has had 3 doses), still having pain on right side, C/S last Wednesday 5/24, had vomiting all day yesterday. Received 4 mg Zofran ODT in ambulance.

## 2015-06-10 NOTE — Transfer of Care (Signed)
Immediate Anesthesia Transfer of Care Note  Patient: Sherry [name]  Procedure(s) Performed: Procedure(s): APPENDECTOMY LAPAROSCOPIC (N/A)  Patient Location: PACU  Anesthesia Type:General  Level of Consciousness: awake and alert   Airway & Oxygen Therapy: Patient Spontanous Breathing and Patient connected to face mask oxygen  Post-op Assessment: Report given to RN and Post -op Vital signs reviewed and stable  Post vital signs: Reviewed and stable  Last Vitals:  Filed Vitals:   06/10/15 2030 06/10/15 2345  BP: 126/65 145/81  Pulse: 125   Temp: 37.4 C 37.7 C  Resp: 17     Last Pain:  Filed Vitals:   06/10/15 2350  PainSc: 0-No pain      Patients Stated Pain Goal: 0 (06/10/15 1645)  Complications: No apparent anesthesia complications

## 2015-06-10 NOTE — MAU Provider Note (Signed)
Chief Complaint: Tachycardia and Emesis   First Provider Initiated Contact with Patient 06/10/15 1312     SUBJECTIVE HPI: Sherry [name] is a 30 y.o. G3P1021 at 9 days post-op C/S on 06/01/15 who presents to [facility] Admissions reporting worsening pain, N/V and fever. Since being Dx'd w/ post-op infection. Does not know  what kind of infection. Thinks the AXB was Augmentin, but not sure. Has taking three doses since yesterday as directed.   Location: RLQ Quality: sharp  Severity: 10/10 on pain scale Duration: 3-4 days Context: Post-op C/S Course: Worsening Timing: constant Modifying factors: Slight improvement w/ Percocet Ibuprofen.  Associated signs and symptoms: Pos for fever, chills, difficulty passing urine  Past Medical History  Diagnosis Date  . Allergy   . Anxiety   . Postpartum care following cesarean delivery (5/24) 06/02/2015   OB History  Gravida Para Term Preterm AB SAB TAB Ectopic Multiple Living  0 1    # Outcome Date GA Lbr Len/2nd Weight Sex Delivery Anes PTL Lv  3 Term 06/01/15 [redacted]w[redacted]d  10 lb 14.3 oz (4.941 kg) M CS-LTranv Spinal  Y     Comments: NONE  2 SAB           1 SAB              Past Surgical History  Procedure Laterality Date  . No past surgeries    . Robotic assisted laparoscopic ovarian cystectomy Left 04/22/2014    Procedure: ROBOTIC ASSISTED LAPAROSCOPIC OVARIAN CYSTECTOMY;  Surgeon: Genia Del, MD;  Location: [facility];  Service: Gynecology;  Laterality: Left;  . Chromopertubation N/A 04/22/2014    Procedure: CHROMOPERTUBATION;  Surgeon: Genia Del, MD;  Location: [facility];  Service: Gynecology;  Laterality: N/A;  Fallopian Tubes  . Cesarean section N/A 06/01/2015    Procedure: PRIMARY CESAREAN SECTION;  Surgeon: Genia Del, MD;  Location: [facility] SU[facility];  Service: Obstetrics;  Laterality: N/A;   Social History   Social History  . Marital Status: Married    Spouse Name: N/A  . Number of Children: N/A  .  Years of Education: N/A   Occupational History  . Not on file.   Social History Main Topics  . Smoking status: Never Smoker   . Smokeless tobacco: Never Used  . Alcohol Use: 1.2 - 1.8 oz/week    2-3 Glasses of wine per week  . Drug Use: No  . Sexual Activity: No   Other Topics Concern  . Not on file   Social History Narrative   No current facility-administered medications on file prior to encounter.   Current Outpatient Prescriptions on File Prior to Encounter  Medication Sig Dispense Refill  . acetaminophen (TYLENOL) 325 MG tablet Take 650-975 mg by mouth every 6 (six) hours as needed for mild pain or headache.    . calcium carbonate (TUMS - DOSED IN MG ELEMENTAL CALCIUM) 500 MG chewable tablet Chew 1 tablet by mouth as needed for indigestion or heartburn. Patient takes 1 tablet 6-8 times daily.    Marland Kitchen doxylamine, Sleep, (UNISOM) 25 MG tablet Take 25 mg by mouth at bedtime as needed for sleep.    . ferrous sulfate 325 (65 FE) MG tablet Take 1 tablet (325 mg total) by mouth 2 (two) times daily with a meal. 60 tablet 3  . ibuprofen (ADVIL,MOTRIN) 800 MG tablet Take 1 tablet (800 mg total) by mouth every 6 (six) hours as needed for mild pain or cramping.  30 tablet 0  . magnesium oxide (MAG-OX) 400 (241.3 Mg) MG tablet Take 1 tablet (400 mg total) by mouth daily. 30 tablet 3  . oxyCODONE-acetaminophen (PERCOCET/ROXICET) 5-325 MG tablet Take 2 tablets by mouth every 4 (four) hours as needed for moderate pain or severe pain. 30 tablet 0  . Prenatal Vit-Fe Fumarate-FA (PRENATAL MULTIVITAMIN) TABS tablet Take 1 tablet by mouth daily at 12 noon.     No Known Allergies  I have reviewed the past Medical Hx, Surgical Hx, Social Hx, Allergies and Medications.   Review of Systems  Constitutional: Positive for chills, appetite change and fatigue. Negative for fever.  HENT: Negative for congestion and sore throat.   Respiratory: Negative for cough.   Gastrointestinal: Positive for nausea,  vomiting and abdominal pain. Negative for diarrhea, blood in stool and abdominal distention.  Genitourinary: Positive for vaginal bleeding (normal lochia) and difficulty urinating. Negative for dysuria, hematuria, flank pain and pelvic pain.  Musculoskeletal: Negative for back pain and neck stiffness.  Skin:       No drainage or bleeding from incision  Neurological: Negative for dizziness and headaches.    OBJECTIVE Patient Vitals for the past 24 hrs:  BP Temp Temp src Pulse Resp SpO2  06/10/15 1339 - - - (!) 149 24 98 %  06/10/15 1309 108/56 mmHg 101.9 F (38.8 C) Oral (!) 151 (!) 28 98 %  06/10/15 1246 - 98.7 F (37.1 C) Oral - - -  06/10/15 1245 98/63 mmHg - Oral (!) 160 20 97 %   Temp 103.1  Constitutional: Well-developed, well-nourished female in Moderate distress. No-appearing, Pale. Diaphoretic. Cardiovascular: Tachycardic. Respiratory: Tachypnea. Clear to auscultation bilaterally. GI: Abd soft, mild generalized tenderness. Severe right lower quadrant tenderness, Involuntary guarding, questionable rebound tenderness. Uterus possibly sub-involuted, but exam limited by patient's extreme pain.Marland Kitchen. Pos BS x 4. Neurologic: Alert and oriented x 4.  GU: Neg CVAT.  SPECULUM EXAM: Deferred  LAB RESULTS Results for orders placed or performed during the hospital encounter of 06/10/15 (from the past 168 hour(s))  CBC with Differential/Platelet   Collection Time: 06/10/15  2:05 PM  Result Value Ref Range   WBC 2.3 (L) 4.0 - 10.5 K/uL   RBC 4.28 3.87 - 5.11 MIL/uL   Hemoglobin 10.5 (L) 12.0 - 15.0 g/dL   HCT 96.034.9 (L) 45.436.0 - 09.846.0 %   MCV 81.5 78.0 - 100.0 fL   MCH 24.5 (L) 26.0 - 34.0 pg   MCHC 30.1 30.0 - 36.0 g/dL   RDW 11.917.2 (H) 14.711.5 - 82.915.5 %   Platelets 208 150 - 400 K/uL   Neutrophils Relative % 93 %   Neutro Abs 2.2 1.7 - 7.7 K/uL   Lymphocytes Relative 7 %   Lymphs Abs 0.2 (L) 0.7 - 4.0 K/uL   Monocytes Relative 0 %   Monocytes Absolute 0.0 (L) 0.1 - 1.0 K/uL   Eosinophils  Relative 0 %   Eosinophils Absolute 0.0 0.0 - 0.7 K/uL   Basophils Relative 0 %   Basophils Absolute 0.0 0.0 - 0.1 K/uL  Lactic acid, plasma   Collection Time: 06/10/15  2:05 PM  Result Value Ref Range   Lactic Acid, Venous 3.1 (HH) 0.5 - 2.0 mmol/L  Comprehensive metabolic panel   Collection Time: 06/10/15  2:05 PM  Result Value Ref Range   Sodium 136 135 - 145 mmol/L   Potassium 3.4 (L) 3.5 - 5.1 mmol/L   Chloride 105 101 - 111 mmol/L   CO2 21 (L) 22 -  32 mmol/L   Glucose, Bld 85 65 - 99 mg/dL   BUN 10 6 - 20 mg/dL   Creatinine, Ser 4.09 (H) 0.44 - 1.00 mg/dL   Calcium 8.0 (L) 8.9 - 10.3 mg/dL   Total Protein 5.9 (L) 6.5 - 8.1 g/dL   Albumin 2.9 (L) 3.5 - 5.0 g/dL   AST 22 15 - 41 U/L   ALT 15 14 - 54 U/L   Alkaline Phosphatase 206 (H) 38 - 126 U/L   Total Bilirubin 1.5 (H) 0.3 - 1.2 mg/dL   GFR calc non Af Amer >60 >60 mL/min   GFR calc Af Amer >60 >60 mL/min   Anion gap 10 5 - 15  Type and screen [facility] [place]   Collection Time: 06/10/15  2:05 PM  Result Value Ref Range   ABO/RH(D) O POS    Antibody Screen NEG    Sample Expiration 06/13/2015   Lactic acid, plasma   Collection Time: 06/10/15  3:10 PM  Result Value Ref Range   Lactic Acid, Venous 2.6 (HH) 0.5 - 2.0 mmol/L  Urinalysis, Routine w reflex microscopic (not at [facility])   Collection Time: 06/10/15  3:15 PM  Result Value Ref Range   Color, Urine YELLOW YELLOW   APPearance CLEAR CLEAR   Specific Gravity, Urine 1.010 1.005 - 1.030   pH 6.0 5.0 - 8.0   Glucose, UA NEGATIVE NEGATIVE mg/dL   Hgb urine dipstick NEGATIVE NEGATIVE   Bilirubin Urine NEGATIVE NEGATIVE   Ketones, ur NEGATIVE NEGATIVE mg/dL   Protein, ur NEGATIVE NEGATIVE mg/dL   Nitrite NEGATIVE NEGATIVE   Leukocytes, UA NEGATIVE NEGATIVE         IMAGING Ct Abdomen Pelvis W Contrast  06/10/2015  ADDENDUM REPORT: 06/10/2015 17:05 ADDENDUM: Study discussed by telephone with [facility] Provider Dorathy Kinsman on 06/10/2015 at  17:03 . Electronically Signed   By: Odessa Fleming M.D.   On: 06/10/2015 17:05  06/10/2015  CLINICAL DATA:  30 year old female 9 days postpartum status post C-section with right lower quadrant abdominal pain for about 30 hours. Diagnosed with uterine infection and started antibiotics, but pain persists. Initial encounter. EXAM: CT ABDOMEN AND PELVIS WITH CONTRAST TECHNIQUE: Multidetector CT imaging of the abdomen and pelvis was performed using the standard protocol following bolus administration of intravenous contrast. CONTRAST:  ISOVUE-300 IOPAMIDOL (ISOVUE-300) INJECTION 61% COMPARISON:  None. FINDINGS: Mild dependent atelectasis at both lung bases. No pericardial or pleural effusions. No acute osseous abnormality identified. Foley type catheter within the urinary bladder which is largely decompressed. There is a small volume of air in fluid within the bladder. There is a small volume of pelvic free fluid. Mild uterine enlargement compatible with postpartum state and/or mild endometritis. Decompressed distal colon. Negative sigmoid and descending colon. Mild motion artifact in the mid abdomen. Mild redundancy of the transverse colon which otherwise appears negative. Small volume free fluid along the right liver tip tracking to the gallbladder fossa. Confluent inflammatory stranding in the right lower quadrant with associated retroperitoneal and para renal stranding on the right. Abnormally thickened and indistinct appearance of the appendix as it emerges from the cecum which measures up to 15 mm diameter (series 2, image 67). The appendix then appears to course cephalad with mural hyper enhancement and surrounding phlegmon type changes. There is associated secondary inflammation of the terminal ileum, including a rounded 2-3 cm area of loculated appearing fluid density along the lateral aspect of the TI (series 2, image 63 and coronal image  40). Oral contrast was administered but has not yet reached the distal  small bowel. There is no extraluminal gas or pneumoperitoneum identified. Mid in proximal small bowel loops have a more normal appearance. Negative stomach and duodenum. No other free fluid about the liver. Liver enhancement is within normal limits. Aside from the small volume of free fluid tracking to the gallbladder fossa, the gallbladder appears normal. No adjacent fat stranding. Negative spleen, pancreas and adrenal glands. Portal venous system is patent. Major arterial structures in the abdomen and pelvis are patent. Mild reactive appearing right lower quadrant mesentery lymph node prominence. Both kidneys appear within normal limits. C-section scar along the lower ventral abdomen incidentally noted. On delayed images there is symmetric and normal bilateral renal contrast excretion the right ureter is normal to the ureterovesical junction. 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 IMPRESSION: 1. Positive for acute appendicitis with possible perforation. Right lower quadrant phlegmon and 2-3 cm area of loculated fluid/developing abscess along the terminal ileum. No extraluminal gas or pneumoperitoneum identified. 2. Small volume free fluid about the inferior liver and in the pelvis. Electronically Signed: By: Odessa Fleming M.D. On: 06/10/2015 16:56    MAU COURSE Sepsis protocol initiated. Dr. Juliene Pina to see patient. Dr. Juliene Pina assumed care of patient.  Vancomycin, Zosyn started.  MDM - Acute appendicitis with possible perforation - 9 days postop C-section. - sepsis  ASSESSMENT 1. Acute appendicitis with rupture   2.      Sepsis  PLAN Transfer to [facility] for appendectomy  Dorathy Kinsman, CNM 06/10/2015  1:41 PM

## 2015-06-10 NOTE — H&P (Signed)
Sherry KollerJessica C [name] is an 30 y.o. female brought in by Paramedics after she developed fever, chills, rigors and felt dizzy/failt at home and worsening RLQ pain, vomiting, not feeling well. No fall. Her mother was with her and helped her and called Paramedics. S/p Elective planned C/s for macrosomic baby on 06/01/15, uncomplicated surgery, unlabored uterus, intact membranes, no infection and was discharged home on post-op day 3 on 06/04/15.   Patient was doing well until 5/31 (Wed) when she vomited a few times and started to have RLQ pain. She was advised to take Percocet overnight and had office exam yesterday 6/1 that was suggestive of endometritis and ws started on PO Augmentin since didn't have any more vomiting or high fever. Movement and touching makes pain worse. She feels tachycardia, SOB/. Pt took 3 doses of Augmentin yesterday and one this morning. She is taking Percocet. But started fever/chills/ vomiting as in HPI and was brought here.  No h/o gall stones and no renal stones, no flank pain. No diarrhea. No sick contact. No prior h/o appedicectomy. Has urinary hesitancy but no dysuria/flank pain.   G1P1, term C/s, 10'14" baby. At home with her mother. Pt is breast feeding, denies breast complaints.    Patient's last menstrual period was 08/27/2014.    Past Medical History  Diagnosis Date  . Allergy   . Anxiety   . Postpartum care following cesarean delivery (5/24) 06/02/2015    Past Surgical History  Procedure Laterality Date  . No past surgeries    . Robotic assisted laparoscopic ovarian cystectomy Left 04/22/2014    Procedure: ROBOTIC ASSISTED LAPAROSCOPIC OVARIAN CYSTECTOMY;  Surgeon: Genia DelMarie-Lyne Lavoie, MD;  Location: [facility];  Service: Gynecology;  Laterality: Left;  . Chromopertubation N/A 04/22/2014    Procedure: CHROMOPERTUBATION;  Surgeon: Genia DelMarie-Lyne Lavoie, MD;  Location: [facility];  Service: Gynecology;  Laterality: N/A;  Fallopian Tubes  . Cesarean section N/A 06/01/2015   Procedure: PRIMARY CESAREAN SECTION;  Surgeon: Genia DelMarie-Lyne Lavoie, MD;  [facility];  Service: Obstetrics;  Laterality: N/A;    Family History  Problem Relation Age of Onset  . Depression Mother   . Varicose Veins Mother   . Cancer Father     pancreatic  . Stroke Father     Social History:  reports that she has never smoked. She has never used smokeless tobacco. She reports that she drinks about 1.2 - 1.8 oz of alcohol per week. She reports that she does not use illicit drugs.  Allergies: No Known Allergies  Prescriptions prior to admission  Medication Sig Dispense Refill Last Dose  . acetaminophen (TYLENOL) 325 MG tablet Take 650-975 mg by mouth every 6 (six) hours as needed for mild pain or headache.   Past Month at Unknown time  . amoxicillin-clavulanate (AUGMENTIN) 875-125 MG tablet Take 1 tablet by mouth 2 (two) times daily.   0 06/10/2015 at Unknown time  . calcium carbonate (TUMS - DOSED IN MG ELEMENTAL CALCIUM) 500 MG chewable tablet Chew 1 tablet by mouth as needed for indigestion or heartburn. Patient takes 1 tablet 6-8 times daily.   Past Month at Unknown time  . ferrous sulfate 325 (65 FE) MG tablet Take 1 tablet (325 mg total) by mouth 2 (two) times daily with a meal. 60 tablet 3 Past Week at Unknown time  . ibuprofen (ADVIL,MOTRIN) 800 MG tablet Take 1 tablet (800 mg total) by mouth every 6 (six) hours as needed for mild pain or cramping. 30 tablet 0 Past Week at  Unknown time  . magnesium oxide (MAG-OX) 400 (241.3 Mg) MG tablet Take 1 tablet (400 mg total) by mouth daily. 30 tablet 3 Past Week at Unknown time  . oxyCODONE-acetaminophen (PERCOCET/ROXICET) 5-325 MG tablet Take 2 tablets by mouth every 4 (four) hours as needed for moderate pain or severe pain. 30 tablet 0 06/10/2015 at Unknown time  . Prenatal Vit-Fe Fumarate-FA (PRENATAL MULTIVITAMIN) TABS tablet Take 1 tablet by mouth daily at 12 noon.   06/09/2015 at Unknown time    ROS  above  Blood pressure  118/64, pulse 133, temperature 99.2 F (37.3 C), temperature source Axillary, resp. rate 26, height 6' (1.829 m), weight 232 lb (105.235 kg), last menstrual period 08/27/2014, SpO2 98 %, unknown if currently breastfeeding. Physical Exam Physical exam:  A&O x 3, appears uncomfortable. Skin warm, pink/ normal. Not clammy. Peripheral pulse rapid, low volume but capillary refill normal  HEENT neg, cranial nerves non-focal Lungs CTA bilat CV Tachycardia. Peripheral pulses low volume. EKG- sinus tachy Abdo - RLQ tenderness, rebound and guarding localized to RLQ and periumbilical area. Bowel sounds sluggish. Uterus fundus 3 finger below umbilicus, non tender. Incision clean, dry, no infection  Extr no edema/ tenderness Pelvic deferred.  Results for orders placed or performed during the hospital encounter of 06/10/15 (from the past 24 hour(s))  CBC with Differential/Platelet     Status: Abnormal   Collection Time: 06/10/15  2:05 PM  Result Value Ref Range   WBC 2.3 (L) 4.0 - 10.5 K/uL   RBC 4.28 3.87 - 5.11 MIL/uL   Hemoglobin 10.5 (L) 12.0 - 15.0 g/dL   HCT 16.1 (L) 09.6 - 04.5 %   MCV 81.5 78.0 - 100.0 fL   MCH 24.5 (L) 26.0 - 34.0 pg   MCHC 30.1 30.0 - 36.0 g/dL   RDW 40.9 (H) 81.1 - 91.4 %   Platelets 208 150 - 400 K/uL   Neutrophils Relative % 93 %   Neutro Abs 2.2 1.7 - 7.7 K/uL   Lymphocytes Relative 7 %   Lymphs Abs 0.2 (L) 0.7 - 4.0 K/uL   Monocytes Relative 0 %   Monocytes Absolute 0.0 (L) 0.1 - 1.0 K/uL   Eosinophils Relative 0 %   Eosinophils Absolute 0.0 0.0 - 0.7 K/uL   Basophils Relative 0 %   Basophils Absolute 0.0 0.0 - 0.1 K/uL  Lactic acid, plasma     Status: Abnormal   Collection Time: 06/10/15  2:05 PM  Result Value Ref Range   Lactic Acid, Venous 3.1 (HH) 0.5 - 2.0 mmol/L  Comprehensive metabolic panel     Status: Abnormal   Collection Time: 06/10/15  2:05 PM  Result Value Ref Range   Sodium 136 135 - 145 mmol/L   Potassium 3.4 (L) 3.5 - 5.1 mmol/L   Chloride  105 101 - 111 mmol/L   CO2 21 (L) 22 - 32 mmol/L   Glucose, Bld 85 65 - 99 mg/dL   BUN 10 6 - 20 mg/dL   Creatinine, Ser 7.82 (H) 0.44 - 1.00 mg/dL   Calcium 8.0 (L) 8.9 - 10.3 mg/dL   Total Protein 5.9 (L) 6.5 - 8.1 g/dL   Albumin 2.9 (L) 3.5 - 5.0 g/dL   AST 22 15 - 41 U/L   ALT 15 14 - 54 U/L   Alkaline Phosphatase 206 (H) 38 - 126 U/L   Total Bilirubin 1.5 (H) 0.3 - 1.2 mg/dL   GFR calc non Af Amer >60 >60 mL/min   GFR calc  Af Amer >60 >60 mL/min   Anion gap 10 5 - 15  Type and screen [facility] [place]     Status: None   Collection Time: 06/10/15  2:05 PM  Result Value Ref Range   ABO/RH(D) O POS    Antibody Screen NEG    Sample Expiration 06/13/2015   Urinalysis, Routine w reflex microscopic (not at [facility])     Status: None   Collection Time: 06/10/15  3:15 PM  Result Value Ref Range   Color, Urine YELLOW YELLOW   APPearance CLEAR CLEAR   Specific Gravity, Urine 1.010 1.005 - 1.030   pH 6.0 5.0 - 8.0   Glucose, UA NEGATIVE NEGATIVE mg/dL   Hgb urine dipstick NEGATIVE NEGATIVE   Bilirubin Urine NEGATIVE NEGATIVE   Ketones, ur NEGATIVE NEGATIVE mg/dL   Protein, ur NEGATIVE NEGATIVE mg/dL   Nitrite NEGATIVE NEGATIVE   Leukocytes, UA NEGATIVE NEGATIVE    No results found.  Assessment/Plan: Post-op C/section Day 9. Acute abdomen, localizing more in RLQ. Pelvic abscess v/s acute appendicitis.  Sepsis protocol called. IV hydration. WBC very low, left shift, CMP Crea- 1.2. Lactic acid pending. Vanco/Zosyn per protocol, strict I/O. CT abdo./pelvic with contrast awaited. Will contact Gen Surg now and give heads up.  Patient counseled, reviewed exam/ labs and concerns. Need for hospitalization, possible [facility] transfer if ICU needed. Agrees.  Ok to pump breast milk. Keep NPO.   Peggi Yono R 06/10/2015, 3:58 PM

## 2015-06-10 NOTE — Anesthesia Procedure Notes (Signed)
Procedure Name: Intubation Date/Time: 06/10/2015 10:08 PM Performed by: Minerva EndsMIRARCHI, Leandre Wien M Pre-anesthesia Checklist: Patient identified, Emergency Drugs available, Suction available and Patient being monitored Patient Re-evaluated:Patient Re-evaluated prior to inductionOxygen Delivery Method: Circle System Utilized Preoxygenation: Pre-oxygenation with 100% oxygen Intubation Type: IV induction Ventilation: Mask ventilation without difficulty Laryngoscope Size: Miller and 2 Tube type: Oral Tube size: 7.0 mm Number of attempts: 1 Airway Equipment and Method: Stylet Placement Confirmation: ETT inserted through vocal cords under direct vision,  positive ETCO2 and breath sounds checked- equal and bilateral Tube secured with: Tape Dental Injury: Teeth and Oropharynx as per pre-operative assessment  Comments: Smooth RSI--- Hart RochesterHollis--- intubation AM CRNA atraumatic teeth and mouth as preop- bilat BS Hart RochesterHollis

## 2015-06-10 NOTE — Progress Notes (Signed)
Pharmacy Antibiotic Note  Sherry [name] is a 30 y.o. female admitted on 06/10/2015 with r/o sepsis - pelvic abscess vs. acute appendicitis.  Pharmacy has been consulted for Vancomycin and Zosyn dosing.  Of note, patient is 9 days s/p elective planned C-section for macrosomic baby 5/24.  Inpatient stay unremarkable, discharged 5/27, post-op day 3.  5/31 started having RLQ pain, saw MD in office 6/1 started on PO augmentin for endometritis.  Now admitted with wornsening pain, fever, chills, vomitting.    Received vancmoycin 1g x1 at 1549 and zoysn 3.375g IV x 1 at 1515.  Plan: - Zosyn 3.375G IV q8h to be infused over 4 hours - Vancomycin 1g IV q8h - Follow up SCr, UOP, cultures, clinical course and adjust as clinically indicated    Height: 6' (182.9 cm) Weight: 232 lb (105.235 kg) IBW/kg (Calculated) : 73.1  Temp (24hrs), Avg:100.7 F (38.2 C), Min:98.7 F (37.1 C), Max:103.1 F (39.5 C)   Recent Labs Lab 06/10/15 1405  WBC 2.3*  CREATININE 1.05*  LATICACIDVEN 3.1*    Estimated Creatinine Clearance: 105.3 mL/min (by C-G formula based on Cr of 1.05).    No Known Allergies  Antimicrobials this admission: Vancomycin 06/10/15 >>  Zosyn 06/10/15 >>   Dose adjustments this admission:   Microbiology results: 6/2 BCx: pending 6/2 UCx: pending   Thank you for allowing pharmacy to be a part of this patient's care.  Drusilla KannerGrimsley, Yunuen Mordan Lydia 06/10/2015 4:22 PM

## 2015-06-10 NOTE — H&P (Signed)
Sherry [name] is an 30 y.o. female.    General Surgery [facility]  Chief Complaint: abdominal pain, sepsis syndrome, acute appendicitis with possible perforation  HPI: patient is a 30 yo WF who is 8 days post-partum from birth of first child by C-section.  RLQ pain after discharge home with baby.  Developed fever and chills.  Seen by obstetrician [facility]) and started on Augmentin for suspected endometritis. Mild improvement, then increasing fever, shaking chills.  EMS called and patient evaluated and transported to Maternity Admissions at [facility].  WBC 2.3.  Lactate elevated.  CTA positive for probable acute appendicitis with perforation.  General surgery contacted and patient transferred to [facility] and admitted to the general surgery service.  Zosyn and Vanco started prior to transfer.  Previous left ovarian procedure, recent C-section.  Otherwise in good health.  Past Medical History  Diagnosis Date  . Allergy   . Anxiety   . Postpartum care following cesarean delivery (5/24) 06/02/2015    Past Surgical History  Procedure Laterality Date  . No past surgeries    . Robotic assisted laparoscopic ovarian cystectomy Left 04/22/2014    Procedure: ROBOTIC ASSISTED LAPAROSCOPIC OVARIAN CYSTECTOMY;  Surgeon: Princess Bruins, MD;  Location: [facility];  Service: Gynecology;  Laterality: Left;  . Chromopertubation N/A 04/22/2014    Procedure: CHROMOPERTUBATION;  Surgeon: Princess Bruins, MD;  Location: [facility];  Service: Gynecology;  Laterality: N/A;  Fallopian Tubes  . Cesarean section N/A 06/01/2015    Procedure: PRIMARY CESAREAN SECTION;  Surgeon: Princess Bruins, MD;  Location: [place];  Service: Obstetrics;  Laterality: N/A;    Family History  Problem Relation Age of Onset  . Depression Mother   . Varicose Veins Mother   . Cancer Father     pancreatic  . Stroke Father    Social History:  reports that she has never  smoked. She has never used smokeless tobacco. She reports that she drinks about 1.2 - 1.8 oz of alcohol per week. She reports that she does not use illicit drugs.  Allergies: No Known Allergies  Medications Prior to Admission  Medication Sig Dispense Refill  . acetaminophen (TYLENOL) 325 MG tablet Take 650-975 mg by mouth every 6 (six) hours as needed for mild pain or headache.    Marland Kitchen amoxicillin-clavulanate (AUGMENTIN) 875-125 MG tablet Take 1 tablet by mouth 2 (two) times daily.   0  . calcium carbonate (TUMS - DOSED IN MG ELEMENTAL CALCIUM) 500 MG chewable tablet Chew 1 tablet by mouth as needed for indigestion or heartburn. Patient takes 1 tablet 6-8 times daily.    . ferrous sulfate 325 (65 FE) MG tablet Take 1 tablet (325 mg total) by mouth 2 (two) times daily with a meal. 60 tablet 3  . ibuprofen (ADVIL,MOTRIN) 800 MG tablet Take 1 tablet (800 mg total) by mouth every 6 (six) hours as needed for mild pain or cramping. 30 tablet 0  . magnesium oxide (MAG-OX) 400 (241.3 Mg) MG tablet Take 1 tablet (400 mg total) by mouth daily. 30 tablet 3  . oxyCODONE-acetaminophen (PERCOCET/ROXICET) 5-325 MG tablet Take 2 tablets by mouth every 4 (four) hours as needed for moderate pain or severe pain. 30 tablet 0  . Prenatal Vit-Fe Fumarate-FA (PRENATAL MULTIVITAMIN) TABS tablet Take 1 tablet by mouth daily at 12 noon.      Results for orders placed or performed during the hospital encounter of 06/10/15 (from the past 48 hour(s))  CBC with  Differential/Platelet     Status: Abnormal   Collection Time: 06/10/15  2:05 PM  Result Value Ref Range   WBC 2.3 (L) 4.0 - 10.5 K/uL   RBC 4.28 3.87 - 5.11 MIL/uL   Hemoglobin 10.5 (L) 12.0 - 15.0 g/dL   HCT 34.9 (L) 36.0 - 46.0 %   MCV 81.5 78.0 - 100.0 fL   MCH 24.5 (L) 26.0 - 34.0 pg   MCHC 30.1 30.0 - 36.0 g/dL   RDW 17.2 (H) 11.5 - 15.5 %   Platelets 208 150 - 400 K/uL   Neutrophils Relative % 93 %   Neutro Abs 2.2 1.7 - 7.7 K/uL   Lymphocytes Relative 7  %   Lymphs Abs 0.2 (L) 0.7 - 4.0 K/uL   Monocytes Relative 0 %   Monocytes Absolute 0.0 (L) 0.1 - 1.0 K/uL   Eosinophils Relative 0 %   Eosinophils Absolute 0.0 0.0 - 0.7 K/uL   Basophils Relative 0 %   Basophils Absolute 0.0 0.0 - 0.1 K/uL  Lactic acid, plasma     Status: Abnormal   Collection Time: 06/10/15  2:05 PM  Result Value Ref Range   Lactic Acid, Venous 3.1 (HH) 0.5 - 2.0 mmol/L    Comment: CRITICAL RESULT CALLED TO, READ BACK BY AND VERIFIED WITH: Trish Fountain 1529 06/10/15 D BRADLEY Performed at [facility]   Comprehensive metabolic panel     Status: Abnormal   Collection Time: 06/10/15  2:05 PM  Result Value Ref Range   Sodium 136 135 - 145 mmol/L   Potassium 3.4 (L) 3.5 - 5.1 mmol/L   Chloride 105 101 - 111 mmol/L   CO2 21 (L) 22 - 32 mmol/L   Glucose, Bld 85 65 - 99 mg/dL   BUN 10 6 - 20 mg/dL   Creatinine, Ser 1.05 (H) 0.44 - 1.00 mg/dL   Calcium 8.0 (L) 8.9 - 10.3 mg/dL   Total Protein 5.9 (L) 6.5 - 8.1 g/dL   Albumin 2.9 (L) 3.5 - 5.0 g/dL   AST 22 15 - 41 U/L   ALT 15 14 - 54 U/L   Alkaline Phosphatase 206 (H) 38 - 126 U/L   Total Bilirubin 1.5 (H) 0.3 - 1.2 mg/dL   GFR calc non Af Amer >60 >60 mL/min   GFR calc Af Amer >60 >60 mL/min    Comment: (NOTE) The eGFR has been calculated using the CKD EPI equation. This calculation has not been validated in all clinical situations. eGFR's persistently <60 mL/min signify possible Chronic Kidney Disease.    Anion gap 10 5 - 15  Type and screen [place]     Status: None   Collection Time: 06/10/15  2:05 PM  Result Value Ref Range   ABO/RH(D) O POS    Antibody Screen NEG    Sample Expiration 06/13/2015   Lactic acid, plasma     Status: Abnormal   Collection Time: 06/10/15  3:10 PM  Result Value Ref Range   Lactic Acid, Venous 2.6 (HH) 0.5 - 2.0 mmol/L    Comment: CRITICAL RESULT CALLED TO, READ BACK BY AND VERIFIED WITH: D CARTER,RN 1626 06/10/15 D BRADLEY Performed at [facility]   Urinalysis, Routine w reflex microscopic (not at [facility])     Status: None   Collection Time: 06/10/15  3:15 PM  Result Value Ref Range   Color, Urine YELLOW YELLOW   APPearance CLEAR CLEAR   Specific Gravity, Urine 1.010 1.005 - 1.030  pH 6.0 5.0 - 8.0   Glucose, UA NEGATIVE NEGATIVE mg/dL   Hgb urine dipstick NEGATIVE NEGATIVE   Bilirubin Urine NEGATIVE NEGATIVE   Ketones, ur NEGATIVE NEGATIVE mg/dL   Protein, ur NEGATIVE NEGATIVE mg/dL   Nitrite NEGATIVE NEGATIVE   Leukocytes, UA NEGATIVE NEGATIVE    Comment: MICROSCOPIC NOT DONE ON URINES WITH NEGATIVE PROTEIN, BLOOD, LEUKOCYTES, NITRITE, OR GLUCOSE <1000 mg/dL.   Ct Abdomen Pelvis W Contrast  06/10/2015  ADDENDUM REPORT: 06/10/2015 17:05 ADDENDUM: Study discussed by telephone with [facility] Provider Manya Silvas on 06/10/2015 at 17:03 . Electronically Signed   By: Genevie Ann M.D.   On: 06/10/2015 17:05  06/10/2015  CLINICAL DATA:  30 year old female 9 days postpartum status post C-section with right lower quadrant abdominal pain for about 30 hours. Diagnosed with uterine infection and started antibiotics, but pain persists. Initial encounter. EXAM: CT ABDOMEN AND PELVIS WITH CONTRAST TECHNIQUE: Multidetector CT imaging of the abdomen and pelvis was performed using the standard protocol following bolus administration of intravenous contrast. CONTRAST:  12m ISOVUE-300 IOPAMIDOL (ISOVUE-300) INJECTION 61% COMPARISON:  None. FINDINGS: Mild dependent atelectasis at both lung bases. No pericardial or pleural effusions. No acute osseous abnormality identified. Foley type catheter within the urinary bladder which is largely decompressed. There is a small volume of air in fluid within the bladder. There is a small volume of pelvic free fluid. Mild uterine enlargement compatible with postpartum state and/or mild endometritis. Decompressed distal colon. Negative sigmoid and descending colon. Mild motion artifact in the mid abdomen. Mild  redundancy of the transverse colon which otherwise appears negative. Small volume free fluid along the right liver tip tracking to the gallbladder fossa. Confluent inflammatory stranding in the right lower quadrant with associated retroperitoneal and para renal stranding on the right. Abnormally thickened and indistinct appearance of the appendix as it emerges from the cecum which measures up to 15 mm diameter (series 2, image 67). The appendix then appears to course cephalad with mural hyper enhancement and surrounding phlegmon type changes. There is associated secondary inflammation of the terminal ileum, including a rounded 2-3 cm area of loculated appearing fluid density along the lateral aspect of the TI (series 2, image 63 and coronal image 40). Oral contrast was administered but has not yet reached the distal small bowel. There is no extraluminal gas or pneumoperitoneum identified. Mid in proximal small bowel loops have a more normal appearance. Negative stomach and duodenum. No other free fluid about the liver. Liver enhancement is within normal limits. Aside from the small volume of free fluid tracking to the gallbladder fossa, the gallbladder appears normal. No adjacent fat stranding. Negative spleen, pancreas and adrenal glands. Portal venous system is patent. Major arterial structures in the abdomen and pelvis are patent. Mild reactive appearing right lower quadrant mesentery lymph node prominence. Both kidneys appear within normal limits. C-section scar along the lower ventral abdomen incidentally noted. On delayed images there is symmetric and normal bilateral renal contrast excretion the right ureter is normal to the ureterovesical junction. 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 IMPRESSION: 1. Positive for acute appendicitis with possible perforation. Right lower quadrant phlegmon and 2-3 cm area of loculated fluid/developing abscess along the terminal ileum. No  extraluminal gas or pneumoperitoneum identified. 2. Small volume free fluid about the inferior liver and in the pelvis. Electronically Signed: By: HGenevie AnnM.D. On: 06/10/2015 16:56    Review of Systems  Constitutional: Positive for fever, chills and diaphoresis.  HENT: Negative.   Eyes: Negative.   Respiratory: Negative.   Cardiovascular: Negative.   Gastrointestinal: Positive for nausea and abdominal pain (RLQ).  Genitourinary: Negative.   Musculoskeletal: Negative.   Skin: Negative.   Neurological: Positive for weakness.  Endo/Heme/Allergies: Negative.   Psychiatric/Behavioral: Negative.     Blood pressure 120/70, pulse 126, temperature 101.4 F (38.6 C), temperature source Oral, resp. rate 18, height 6' (1.829 m), weight 105.235 kg (232 lb), last menstrual period 08/27/2014, SpO2 100 %, unknown if currently breastfeeding. Physical Exam  Constitutional: She is oriented to person, place, and time. She appears well-developed and well-nourished. She appears distressed.  HENT:  Head: Normocephalic and atraumatic.  Right Ear: External ear normal.  Left Ear: External ear normal.  Eyes: Conjunctivae are normal. Pupils are equal, round, and reactive to light. No scleral icterus.  Neck: Normal range of motion. Neck supple. No tracheal deviation present. No thyromegaly present.  Cardiovascular: Regular rhythm and normal heart sounds.   No murmur heard. tachycardic  Respiratory: Effort normal and breath sounds normal. No respiratory distress. She has no wheezes.  GI: Soft. She exhibits no distension and no mass. There is tenderness (mild diffuse tenderness, max in RLQ). There is rebound and guarding (RLQ).  Musculoskeletal: Normal range of motion. She exhibits no edema.  Neurological: She is alert and oriented to person, place, and time.  Skin: Skin is warm and dry.  Psychiatric: She has a normal mood and affect. Her behavior is normal.     Assessment/Plan Acute appendicitis with  probable perforation, peritonitis Sepsis syndrome Status post C-section (8 days post partum)  Admit to general surgery service  Continue Zosyn and Vanco for now  Plan urgent operative intervention this evening for laparoscopic appendectomy, possible open procedure.  Discussed with patient, husband, and family members at bedside.  They understand and wish to proceed.  The risks and benefits of the procedure have been discussed at length with the patient.  The patient understands the proposed procedure, potential alternative treatments, and the course of recovery to be expected.  All of the patient's questions have been answered at this time.  The patient wishes to proceed with surgery.  Earnstine Regal, MD, [facility] Office: [place], MD 06/10/2015, 7:51 PM

## 2015-06-10 NOTE — Progress Notes (Signed)
CRITICAL VALUE ALERT  Critical value received:  Lactic Acid  Date of notification:  06/10/15  Time of notification:  1529  Critical value read back:Yes.    Nurse who received alert:  C.Melissa MontaneWicker,RN/Kathy Yaretzy Olazabal,RN  MD notified (1st page):  Dr. Juliene PinaMody  Time of first page:  1529  MD notified (2nd page):  Time of second page:  Responding MD:  1529  Time MD responded:  1529

## 2015-06-10 NOTE — Progress Notes (Signed)
CRITICAL VALUE ALERT  Critical value received:  2.6  Date of notification:  -06/10/15  Time of notification: 1625  Critical value read back:yes  Nurse who received alert:  Morrison Oldee Ashyra Cantin RN  MD notified (1st page): Dr Juliene PinaMody  Time of first page: 1630  MD notified (2nd page):  Time of second page:  Responding MD:    Time MD responded:

## 2015-06-10 NOTE — Anesthesia Preprocedure Evaluation (Addendum)
Anesthesia Evaluation  Patient identified by MRN, date of birth, ID band Patient awake    Reviewed: Allergy & Precautions, NPO status , Patient's Chart, lab work & pertinent test results  Airway Mallampati: III  TM Distance: >3 FB Neck ROM: Full    Dental  (+) Teeth Intact, Dental Advisory Given   Pulmonary neg pulmonary ROS,    breath sounds clear to auscultation       Cardiovascular negative cardio ROS   Rhythm:Regular Rate:Normal     Neuro/Psych PSYCHIATRIC DISORDERS Anxiety negative neurological ROS     GI/Hepatic negative GI ROS, Neg liver ROS,   Endo/Other  negative endocrine ROS  Renal/GU negative Renal ROS  negative genitourinary   Musculoskeletal negative musculoskeletal ROS (+)   Abdominal   Peds negative pediatric ROS (+)  Hematology negative hematology ROS (+)   Anesthesia Other Findings   Reproductive/Obstetrics negative OB ROS                             Lab Results  Component Value Date   WBC 2.3* 06/10/2015   HGB 10.5* 06/10/2015   HCT 34.9* 06/10/2015   MCV 81.5 06/10/2015   PLT 208 06/10/2015   Lab Results  Component Value Date   CREATININE 1.05* 06/10/2015   BUN 10 06/10/2015   NA 136 06/10/2015   K 3.4* 06/10/2015   CL 105 06/10/2015   CO2 21* 06/10/2015   No results found for: INR, PROTIME   Anesthesia Physical Anesthesia Plan  ASA: II  Anesthesia Plan: General   Post-op Pain Management:    Induction: Intravenous, Rapid sequence and Cricoid pressure planned  Airway Management Planned: Oral ETT and Video Laryngoscope Planned  Additional Equipment:   Intra-op Plan:   Post-operative Plan: Extubation in OR  Informed Consent: I have reviewed the patients History and Physical, chart, labs and discussed the procedure including the risks, benefits and alternatives for the proposed anesthesia with the patient or authorized representative who has  indicated his/her understanding and acceptance.   Dental advisory given  Plan Discussed with: CRNA  Anesthesia Plan Comments:        Anesthesia Quick Evaluation

## 2015-06-11 DIAGNOSIS — K3532 Acute appendicitis with perforation and localized peritonitis, without abscess: Secondary | ICD-10-CM | POA: Insufficient documentation

## 2015-06-11 LAB — BASIC METABOLIC PANEL
Anion gap: 7 (ref 5–15)
BUN: 8 mg/dL (ref 6–20)
CHLORIDE: 110 mmol/L (ref 101–111)
CO2: 22 mmol/L (ref 22–32)
Calcium: 7.2 mg/dL — ABNORMAL LOW (ref 8.9–10.3)
Creatinine, Ser: 0.87 mg/dL (ref 0.44–1.00)
GFR calc Af Amer: >60 mL/min (ref >60)
GFR calc non Af Amer: >60 mL/min (ref >60)
Glucose, Bld: 124 mg/dL — ABNORMAL HIGH (ref 65–99)
POTASSIUM: 3.9 mmol/L (ref 3.5–5.1)
SODIUM: 139 mmol/L (ref 135–145)

## 2015-06-11 LAB — URINE CULTURE: Culture: NO GROWTH

## 2015-06-11 LAB — CBC
HCT: 25.5 % — ABNORMAL LOW (ref 36.0–46.0)
HEMOGLOBIN: 7.7 g/dL — AB (ref 12.0–15.0)
MCH: 24.9 pg — AB (ref 26.0–34.0)
MCHC: 30.2 g/dL (ref 30.0–36.0)
MCV: 82.5 fL (ref 78.0–100.0)
Platelets: 157 10*3/uL (ref 150–400)
RBC: 3.09 MIL/uL — AB (ref 3.87–5.11)
RDW: 17 % — ABNORMAL HIGH (ref 11.5–15.5)
WBC: 12.5 10*3/uL — AB (ref 4.0–10.5)

## 2015-06-11 MED ORDER — KCL IN DEXTROSE-NACL 20-5-0.45 MEQ/L-%-% IV SOLN
INTRAVENOUS | Status: DC
  Administered 2015-06-11 – 2015-06-12 (×3): via INTRAVENOUS
  Filled 2015-06-11 (×3): qty 1000

## 2015-06-11 MED ORDER — ONDANSETRON HCL 4 MG/2ML IJ SOLN: 4.0000 mg | Freq: Four times a day (QID) | INTRAMUSCULAR | Status: DC | PRN

## 2015-06-11 MED ORDER — HYDROCODONE-ACETAMINOPHEN 5-325 MG PO TABS
1.0000 | ORAL_TABLET | ORAL | Status: DC | PRN
  Administered 2015-06-11 – 2015-06-13 (×5): 2 via ORAL
  Filled 2015-06-11 (×5): qty 2

## 2015-06-11 MED ORDER — ACETAMINOPHEN 325 MG PO TABS: 650.0000 mg | ORAL_TABLET | Freq: Four times a day (QID) | ORAL | Status: DC | PRN

## 2015-06-11 MED ORDER — KCL IN DEXTROSE-NACL 20-5-0.45 MEQ/L-%-% IV SOLN
INTRAVENOUS | Status: AC
  Filled 2015-06-11: qty 1000

## 2015-06-11 MED ORDER — ONDANSETRON 4 MG PO TBDP: 4.0000 mg | ORAL_TABLET | Freq: Four times a day (QID) | ORAL | Status: DC | PRN

## 2015-06-11 MED ORDER — HYDROMORPHONE HCL 1 MG/ML IJ SOLN
INTRAMUSCULAR | Status: AC
  Filled 2015-06-11: qty 1

## 2015-06-11 MED ORDER — FENTANYL CITRATE (PF) 100 MCG/2ML IJ SOLN
INTRAMUSCULAR | Status: AC
  Filled 2015-06-11: qty 2

## 2015-06-11 MED ORDER — ACETAMINOPHEN 650 MG RE SUPP: 650.0000 mg | Freq: Four times a day (QID) | RECTAL | Status: DC | PRN

## 2015-06-11 MED ORDER — SODIUM CHLORIDE 0.9 % IJ SOLN
INTRAMUSCULAR | Status: AC
  Filled 2015-06-11: qty 10

## 2015-06-11 MED ORDER — HYDROMORPHONE HCL 1 MG/ML IJ SOLN
1.0000 mg | INTRAMUSCULAR | Status: DC | PRN
  Administered 2015-06-11 – 2015-06-12 (×5): 1 mg via INTRAVENOUS
  Filled 2015-06-11 (×4): qty 1

## 2015-06-11 NOTE — Progress Notes (Signed)
Patient ID: Sherry [name], female   DOB: [birth year], 30 y.o.   MRN: [id number]  General Surgery [facility]  Subjective: POD#1 - patient in bed, much more comfortable.  Tolerating liquids.  Voiding.  Less pain.  Objective: Vital signs in last 24 hours: Temp:  [98.7 F (37.1 C)-103.1 F (39.5 C)] 99 F (37.2 C) (06/03 0521) Pulse Rate:  [81-160] 81 (06/03 0521) Resp:  [16-29] 16 (06/03 0521) BP: (97-145)/(56-81) 136/64 mmHg (06/03 0521) SpO2:  [93 %-100 %] 100 % (06/03 0521) Weight:  [105.235 kg (232 lb)-118.343 kg (260 lb 14.4 oz)] 118.343 kg (260 lb 14.4 oz) (06/03 0206) Last BM Date: 07/06/15  Intake/Output from previous day: 06/02 0701 - 06/03 0700 In: 2543.3 [I.V.:2243.3; IV Piggyback:300] Out: 2825 [Urine:2500; Drains:275; Blood:50] Intake/Output this shift:    Physical Exam: HEENT - sclerae clear, mucous membranes moist Neck - soft Chest - clear bilaterally Cor - RRR Abdomen - softer, less tender; dressings dry and intact; JP drain with thin serous Ext - no edema, non-tender Neuro - alert & oriented, no focal deficits  Lab Results:   Recent Labs  06/10/15 1405 06/11/15 0720  WBC 2.3* 12.5*  HGB 10.5* 7.7*  HCT 34.9* 25.5*  PLT 208 157   BMET  Recent Labs  06/10/15 1405 06/11/15 0720  NA 136 139  K 3.4* 3.9  CL 105 110  CO2 21* 22  GLUCOSE 85 124*  BUN 10 8  CREATININE 1.05* 0.87  CALCIUM 8.0* 7.2*   PT/INR No results for input(s): LABPROT, INR in the last 72 hours. Comprehensive Metabolic Panel:    Component Value Date/Time   NA 139 06/11/2015 0720   NA 136 06/10/2015 1405   K 3.9 06/11/2015 0720   K 3.4* 06/10/2015 1405   CL 110 06/11/2015 0720   CL 105 06/10/2015 1405   CO2 22 06/11/2015 0720   CO2 21* 06/10/2015 1405   BUN 8 06/11/2015 0720   BUN 10 06/10/2015 1405   CREATININE 0.87 06/11/2015 0720   CREATININE 1.05* 06/10/2015 1405   GLUCOSE 124* 06/11/2015 0720   GLUCOSE 85 06/10/2015 1405   CALCIUM 7.2*  06/11/2015 0720   CALCIUM 8.0* 06/10/2015 1405   AST 22 06/10/2015 1405   ALT 15 06/10/2015 1405   ALKPHOS 206* 06/10/2015 1405   BILITOT 1.5* 06/10/2015 1405   PROT 5.9* 06/10/2015 1405   ALBUMIN 2.9* 06/10/2015 1405    Studies/Results: Ct Abdomen Pelvis W Contrast  06/10/2015  ADDENDUM REPORT: 06/10/2015 17:05 ADDENDUM: Study discussed by telephone with [facility] Provider Dorathy Kinsman on 06/10/2015 at 17:03 . Electronically Signed   By: Odessa Fleming M.D.   On: 06/10/2015 17:05  06/10/2015  CLINICAL DATA:  30 year old female 9 days postpartum status post C-section with right lower quadrant abdominal pain for about 30 hours. Diagnosed with uterine infection and started antibiotics, but pain persists. Initial encounter. EXAM: CT ABDOMEN AND PELVIS WITH CONTRAST TECHNIQUE: Multidetector CT imaging of the abdomen and pelvis was performed using the standard protocol following bolus administration of intravenous contrast. CONTRAST:  ISOVUE-300 IOPAMIDOL (ISOVUE-300) INJECTION 61% COMPARISON:  None. FINDINGS: Mild dependent atelectasis at both lung bases. No pericardial or pleural effusions. No acute osseous abnormality identified. Foley type catheter within the urinary bladder which is largely decompressed. There is a small volume of air in fluid within the bladder. There is a small volume of pelvic free fluid. Mild uterine enlargement compatible with postpartum state and/or mild endometritis. Decompressed distal colon. Negative  sigmoid and descending colon. Mild motion artifact in the mid abdomen. Mild redundancy of the transverse colon which otherwise appears negative. Small volume free fluid along the right liver tip tracking to the gallbladder fossa. Confluent inflammatory stranding in the right lower quadrant with associated retroperitoneal and para renal stranding on the right. Abnormally thickened and indistinct appearance of the appendix as it emerges from the cecum which measures up to 15 mm  diameter (series 2, image 67). The appendix then appears to course cephalad with mural hyper enhancement and surrounding phlegmon type changes. There is associated secondary inflammation of the terminal ileum, including a rounded 2-3 cm area of loculated appearing fluid density along the lateral aspect of the TI (series 2, image 63 and coronal image 40). Oral contrast was administered but has not yet reached the distal small bowel. There is no extraluminal gas or pneumoperitoneum identified. Mid in proximal small bowel loops have a more normal appearance. Negative stomach and duodenum. No other free fluid about the liver. Liver enhancement is within normal limits. Aside from the small volume of free fluid tracking to the gallbladder fossa, the gallbladder appears normal. No adjacent fat stranding. Negative spleen, pancreas and adrenal glands. Portal venous system is patent. Major arterial structures in the abdomen and pelvis are patent. Mild reactive appearing right lower quadrant mesentery lymph node prominence. Both kidneys appear within normal limits. C-section scar along the lower ventral abdomen incidentally noted. On delayed images there is symmetric and normal bilateral renal contrast excretion the right ureter is normal to the ureterovesical junction. 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 IMPRESSION: 1. Positive for acute appendicitis with possible perforation. Right lower quadrant phlegmon and 2-3 cm area of loculated fluid/developing abscess along the terminal ileum. No extraluminal gas or pneumoperitoneum identified. 2. Small volume free fluid about the inferior liver and in the pelvis. Electronically Signed: By: Odessa FlemingH  Hall M.D. On: 06/10/2015 16:56    Assessment & Plans: Status post lap appendectomy and drainage for perforated appendicitis  Clinically improved  Tachycardia resolved  Discontinue Vanco, continue Zosyn IV  Decrease IVF with increasing po  intake  Encouraged ambulation  Velora Hecklerodd M. Shandelle Borrelli, MD, [facility] Office: [phone]   Cesily Cuoco Judie PetitM 06/11/2015

## 2015-06-11 NOTE — Progress Notes (Signed)
Dr. Hart RochesterHollis made aware of patient's vital signs- in to see patient.

## 2015-06-11 NOTE — Progress Notes (Signed)
Dr.  Hart RochesterHollis made aware of patient's vital signs- temperatures, heart rates, blood pressures, and SAO2S- O.K. To go to floor- per Dr. Hart RochesterHollis.

## 2015-06-11 NOTE — Progress Notes (Addendum)
Patient arrived in the PACU without Foley catheter - had been removed in the O.R.prior to transfer to the PACU. O.R. To notify Dr. Gerrit FriendsGerkin

## 2015-06-11 NOTE — Anesthesia Postprocedure Evaluation (Signed)
Anesthesia Post Note  Patient: Sherry [name]  Procedure(s) Performed: Procedure(s) (LRB): APPENDECTOMY LAPAROSCOPIC (N/A)  Patient location during evaluation: PACU Anesthesia Type: General Level of consciousness: awake and alert Pain management: pain level controlled Vital Signs Assessment: post-procedure vital signs reviewed and stable Respiratory status: spontaneous breathing, nonlabored ventilation, respiratory function stable and patient connected to nasal cannula oxygen Cardiovascular status: blood pressure returned to baseline and stable Postop Assessment: no signs of nausea or vomiting Anesthetic complications: no    Last Vitals:  Filed Vitals:   06/11/15 0418 06/11/15 0521  BP: 126/71 136/64  Pulse: 88 81  Temp: 37.4 C 37.2 C  Resp: 16 16    Last Pain:  Filed Vitals:   06/11/15 0635  PainSc: 4                  Shelton SilvasKevin D Aerith Canal

## 2015-06-12 LAB — CBC
HCT: 24.7 % — ABNORMAL LOW (ref 36.0–46.0)
Hemoglobin: 7.2 g/dL — ABNORMAL LOW (ref 12.0–15.0)
MCH: 24.6 pg — AB (ref 26.0–34.0)
MCHC: 29.1 g/dL — ABNORMAL LOW (ref 30.0–36.0)
MCV: 84.3 fL (ref 78.0–100.0)
PLATELETS: 155 10*3/uL (ref 150–400)
RBC: 2.93 MIL/uL — AB (ref 3.87–5.11)
RDW: 17 % — AB (ref 11.5–15.5)
WBC: 9.7 10*3/uL (ref 4.0–10.5)

## 2015-06-12 LAB — BASIC METABOLIC PANEL
ANION GAP: 6 (ref 5–15)
BUN: 10 mg/dL (ref 6–20)
CALCIUM: 7.7 mg/dL — AB (ref 8.9–10.3)
CO2: 24 mmol/L (ref 22–32)
Chloride: 109 mmol/L (ref 101–111)
Creatinine, Ser: 0.78 mg/dL (ref 0.44–1.00)
GFR calc Af Amer: >60 mL/min (ref >60)
GLUCOSE: 108 mg/dL — AB (ref 65–99)
Potassium: 3.7 mmol/L (ref 3.5–5.1)
SODIUM: 139 mmol/L (ref 135–145)

## 2015-06-12 LAB — BLOOD CULTURE ID PANEL (REFLEXED)
ACINETOBACTER BAUMANNII: NOT DETECTED
CANDIDA ALBICANS: NOT DETECTED
CANDIDA KRUSEI: NOT DETECTED
CARBAPENEM RESISTANCE: NOT DETECTED
Candida glabrata: NOT DETECTED
Candida parapsilosis: NOT DETECTED
Candida tropicalis: NOT DETECTED
ENTEROBACTER CLOACAE COMPLEX: NOT DETECTED
ENTEROBACTERIACEAE SPECIES: NOT DETECTED
Enterococcus species: NOT DETECTED
Escherichia coli: NOT DETECTED
Haemophilus influenzae: NOT DETECTED
KLEBSIELLA OXYTOCA: NOT DETECTED
KLEBSIELLA PNEUMONIAE: NOT DETECTED
Listeria monocytogenes: NOT DETECTED
Methicillin resistance: NOT DETECTED
NEISSERIA MENINGITIDIS: NOT DETECTED
PSEUDOMONAS AERUGINOSA: NOT DETECTED
Proteus species: NOT DETECTED
STAPHYLOCOCCUS AUREUS BCID: NOT DETECTED
STAPHYLOCOCCUS SPECIES: NOT DETECTED
STREPTOCOCCUS AGALACTIAE: NOT DETECTED
STREPTOCOCCUS PNEUMONIAE: NOT DETECTED
STREPTOCOCCUS SPECIES: NOT DETECTED
Serratia marcescens: NOT DETECTED
Streptococcus pyogenes: NOT DETECTED
VANCOMYCIN RESISTANCE: NOT DETECTED

## 2015-06-12 MED ORDER — CHLORHEXIDINE GLUCONATE CLOTH 2 % EX PADS
6.0000 | MEDICATED_PAD | Freq: Every day | CUTANEOUS | Status: DC
Start: 2015-06-12 — End: 2015-06-13
  Administered 2015-06-12 – 2015-06-13 (×2): 6 via TOPICAL

## 2015-06-12 MED ORDER — MUPIROCIN 2 % EX OINT
1.0000 "application " | TOPICAL_OINTMENT | Freq: Two times a day (BID) | CUTANEOUS | Status: DC
  Administered 2015-06-12 – 2015-06-13 (×3): 1 via NASAL
  Filled 2015-06-12: qty 22

## 2015-06-12 NOTE — MAU Note (Signed)
[facility] on a patient whom they reported had been discharged . Information taken and chart reviewed. According to chart patient is a postpartum pt of Dr Juliene PinaMody [facility]. Critical value called to Dr Gerrit FriendsGerkin at 2114, Gram neg rods in blood culture. [facility]. Critical value also called to RN in charge of pt.

## 2015-06-12 NOTE — Progress Notes (Signed)
Patient ID: Sherry [name], female   DOB: [birth year], 30 y.o.   MRN: [id number]  General Surgery [facility]  Subjective: POD#2 - patient without complaints.  Tolerating clear liquids.  Ambulated.  Less pain.  Denies nausea or emesis.  Objective: Vital signs in last 24 hours: Temp:  [98.2 F (36.8 C)-98.8 F (37.1 C)] 98.6 F (37 C) (06/04 0553) Pulse Rate:  [79-89] 79 (06/04 0553) Resp:  [16-18] 16 (06/04 0553) BP: (110-126)/(68-75) 118/68 mmHg (06/04 0553) SpO2:  [96 %-100 %] 96 % (06/04 0553) Last BM Date: 06/08/15  Intake/Output from previous day: 06/03 0701 - 06/04 0700 In: 1780 [P.O.:480; I.V.:1200; IV Piggyback:50] Out: 2693 [Urine:2450; Drains:243] Intake/Output this shift:    Physical Exam: HEENT - sclerae clear, mucous membranes moist Abdomen - soft, dressings dry; JP with thin serous output Ext - no edema, non-tender Neuro - alert & oriented, no focal deficits  Lab Results:   Recent Labs  06/11/15 0720 06/12/15 0354  WBC 12.5* 9.7  HGB 7.7* 7.2*  HCT 25.5* 24.7*  PLT 157 155   BMET  Recent Labs  06/11/15 0720 06/12/15 0354  NA 139 139  K 3.9 3.7  CL 110 109  CO2 22 24  GLUCOSE 124* 108*  BUN 8 10  CREATININE 0.87 0.78  CALCIUM 7.2* 7.7*   PT/INR No results for input(s): LABPROT, INR in the last 72 hours. Comprehensive Metabolic Panel:    Component Value Date/Time   NA 139 06/12/2015 0354   NA 139 06/11/2015 0720   K 3.7 06/12/2015 0354   K 3.9 06/11/2015 0720   CL 109 06/12/2015 0354   CL 110 06/11/2015 0720   CO2 24 06/12/2015 0354   CO2 22 06/11/2015 0720   BUN 10 06/12/2015 0354   BUN 8 06/11/2015 0720   CREATININE 0.78 06/12/2015 0354   CREATININE 0.87 06/11/2015 0720   GLUCOSE 108* 06/12/2015 0354   GLUCOSE 124* 06/11/2015 0720   CALCIUM 7.7* 06/12/2015 0354   CALCIUM 7.2* 06/11/2015 0720   AST 22 06/10/2015 1405   ALT 15 06/10/2015 1405   ALKPHOS 206* 06/10/2015 1405   BILITOT 1.5* 06/10/2015 1405    PROT 5.9* 06/10/2015 1405   ALBUMIN 2.9* 06/10/2015 1405    Studies/Results: Ct Abdomen Pelvis W Contrast  06/10/2015  ADDENDUM REPORT: 06/10/2015 17:05 ADDENDUM: Study discussed by telephone with [facility] Provider Dorathy Kinsman on 06/10/2015 at 17:03 . Electronically Signed   By: Odessa Fleming M.D.   On: 06/10/2015 17:05  06/10/2015  CLINICAL DATA:  30 year old female 9 days postpartum status post C-section with right lower quadrant abdominal pain for about 30 hours. Diagnosed with uterine infection and started antibiotics, but pain persists. Initial encounter. EXAM: CT ABDOMEN AND PELVIS WITH CONTRAST TECHNIQUE: Multidetector CT imaging of the abdomen and pelvis was performed using the standard protocol following bolus administration of intravenous contrast. CONTRAST:  ISOVUE-300 IOPAMIDOL (ISOVUE-300) INJECTION 61% COMPARISON:  None. FINDINGS: Mild dependent atelectasis at both lung bases. No pericardial or pleural effusions. No acute osseous abnormality identified. Foley type catheter within the urinary bladder which is largely decompressed. There is a small volume of air in fluid within the bladder. There is a small volume of pelvic free fluid. Mild uterine enlargement compatible with postpartum state and/or mild endometritis. Decompressed distal colon. Negative sigmoid and descending colon. Mild motion artifact in the mid abdomen. Mild redundancy of the transverse colon which otherwise appears negative. Small volume free fluid along the right liver tip  tracking to the gallbladder fossa. Confluent inflammatory stranding in the right lower quadrant with associated retroperitoneal and para renal stranding on the right. Abnormally thickened and indistinct appearance of the appendix as it emerges from the cecum which measures up to 15 mm diameter (series 2, image 67). The appendix then appears to course cephalad with mural hyper enhancement and surrounding phlegmon type changes. There is associated  secondary inflammation of the terminal ileum, including a rounded 2-3 cm area of loculated appearing fluid density along the lateral aspect of the TI (series 2, image 63 and coronal image 40). Oral contrast was administered but has not yet reached the distal small bowel. There is no extraluminal gas or pneumoperitoneum identified. Mid in proximal small bowel loops have a more normal appearance. Negative stomach and duodenum. No other free fluid about the liver. Liver enhancement is within normal limits. Aside from the small volume of free fluid tracking to the gallbladder fossa, the gallbladder appears normal. No adjacent fat stranding. Negative spleen, pancreas and adrenal glands. Portal venous system is patent. Major arterial structures in the abdomen and pelvis are patent. Mild reactive appearing right lower quadrant mesentery lymph node prominence. Both kidneys appear within normal limits. C-section scar along the lower ventral abdomen incidentally noted. On delayed images there is symmetric and normal bilateral renal contrast excretion the right ureter is normal to the ureterovesical junction. 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 IMPRESSION: 1. Positive for acute appendicitis with possible perforation. Right lower quadrant phlegmon and 2-3 cm area of loculated fluid/developing abscess along the terminal ileum. No extraluminal gas or pneumoperitoneum identified. 2. Small volume free fluid about the inferior liver and in the pelvis. Electronically Signed: By: Odessa FlemingH  Hall M.D. On: 06/10/2015 16:56    Assessment & Plans: Status post lap appendectomy and drainage for perforated appendicitis Clinically improving Zosyn IV Advance to regular diet Encouraged ambulation  Possibly home tomorrow on oral abx  Velora Hecklerodd M. Albina Gosney, MD, [facility] Office: [phone]   Janilah Hojnacki Judie PetitM 06/12/2015

## 2015-06-12 NOTE — Progress Notes (Signed)
[facility] to inform me that pt's blood cultured growing gram neg rods. She states that she informed Dr. Gerrit FriendsGerkin. Pt on Zostyn. No  New orders given.

## 2015-06-13 ENCOUNTER — Encounter (HOSPITAL_COMMUNITY): Payer: Self-pay | Admitting: Surgery

## 2015-06-13 MED ORDER — MUPIROCIN 2 % EX OINT: 1.0000 "application " | TOPICAL_OINTMENT | Freq: Two times a day (BID) | CUTANEOUS | Status: AC

## 2015-06-13 MED ORDER — HYDROCODONE-ACETAMINOPHEN 5-325 MG PO TABS: 1.0000 | ORAL_TABLET | Freq: Four times a day (QID) | ORAL | Status: DC | PRN

## 2015-06-13 MED ORDER — AMOXICILLIN-POT CLAVULANATE 875-125 MG PO TABS: 1.0000 | ORAL_TABLET | Freq: Two times a day (BID) | ORAL | Status: AC

## 2015-06-13 NOTE — Discharge Instructions (Addendum)
Your appointment is at 9:00am, please arrive at least 30min before your appointment to complete your check in paperwork.  If you are unable to arrive 30min prior to your appointment time we may have to cancel or reschedule you. ° °LAPAROSCOPIC SURGERY: POST OP INSTRUCTIONS  °1. DIET: Follow a light bland diet the first 24 hours after arrival home, such as soup, liquids, crackers, etc. Be sure to include lots of fluids daily. Avoid fast food or heavy meals as your are more likely to get nauseated. Eat a low fat the next few days after surgery.  °2. Take your usually prescribed home medications unless otherwise directed. °3. PAIN CONTROL:  °1. Pain is best controlled by a usual combination of three different methods TOGETHER:  °1. Ice/Heat °2. Over the counter pain medication °3. Prescription pain medication °2. Most patients will experience some swelling and bruising around the incisions. Ice packs or heating pads (30-60 minutes up to 6 times a day) will help. Use ice for the first few days to help decrease swelling and bruising, then switch to heat to help relax tight/sore spots and speed recovery. Some people prefer to use ice alone, heat alone, alternating between ice & heat. Experiment to what works for you. Swelling and bruising can take several weeks to resolve.  °3. It is helpful to take an over-the-counter pain medication regularly for the first few weeks. Choose one of the following that works best for you:  °1. Naproxen (Aleve, etc) Two 220mg tabs twice a day °2. Ibuprofen (Advil, etc) Three 200mg tabs four times a day (every meal & bedtime) °3. Acetaminophen (Tylenol, etc) 500-650mg four times a day (every meal & bedtime) °4. A prescription for pain medication (such as oxycodone, hydrocodone, etc) should be given to you upon discharge. Take your pain medication as prescribed.  °1. If you are having problems/concerns with the prescription medicine (does not control pain, nausea, vomiting, rash, itching,  etc), please call us [phone] to see if we need to switch you to a different pain medicine that will work better for you and/or control your side effect better. °2. If you need a refill on your pain medication, please contact your pharmacy. They will contact our office to request authorization. Prescriptions will not be filled after 5 pm or on week-ends. °4. Avoid getting constipated. Between the surgery and the pain medications, it is common to experience some constipation. Increasing fluid intake and taking a fiber supplement (such as Metamucil, Citrucel, FiberCon, MiraLax, etc) 1-2 times a day regularly will usually help prevent this problem from occurring. A mild laxative (prune juice, Milk of Magnesia, MiraLax, etc) should be taken according to package directions if there are no bowel movements after 48 hours.  °5. Watch out for diarrhea. If you have many loose bowel movements, simplify your diet to bland foods & liquids for a few days. Stop any stool softeners and decrease your fiber supplement. Switching to mild anti-diarrheal medications (Kayopectate, Pepto Bismol) can help. If this worsens or does not improve, please call us. °6. Wash / shower every day. You may shower over the dressings as they are waterproof. Continue to shower over incision(s) after the dressing is off. °7. Remove your waterproof bandages 5 days after surgery. You may leave the incision open to air. You may replace a dressing/Band-Aid to cover the incision for comfort if you wish.  °8. ACTIVITIES as tolerated:  °1. You may resume regular (light) daily activities beginning the next day--such as daily self-care,   walking, climbing stairs--gradually increasing activities as tolerated. If you can walk 30 minutes without difficulty, it is safe to try more intense activity such as jogging, treadmill, bicycling, low-impact aerobics, swimming, etc. °2. Save the most intensive and strenuous activity for last such as sit-ups, heavy lifting,  contact sports, etc Refrain from any heavy lifting or straining until you are off narcotics for pain control.  °3. DO NOT PUSH THROUGH PAIN. Let pain be your guide: If it hurts to do something, don't do it. Pain is your body warning you to avoid that activity for another week until the pain goes down. °4. You may drive when you are no longer taking prescription pain medication, you can comfortably wear a seatbelt, and you can safely maneuver your car and apply brakes. °5. You may have sexual intercourse when it is comfortable.  °9. FOLLOW UP in our office  °1. Please call [facility] at [phone] to set up an appointment to see your surgeon in the office for a follow-up appointment approximately 2-3 weeks after your surgery. °2. Make sure that you call for this appointment the day you arrive home to insure a convenient appointment time. °     10. IF YOU HAVE DISABILITY OR FAMILY LEAVE FORMS, BRING THEM TO THE               OFFICE FOR PROCESSING.  ° °WHEN TO CALL US [phone]:  °1. Poor pain control °2. Reactions / problems with new medications (rash/itching, nausea, etc)  °3. Fever over 101.5 F (38.5 C) °4. Inability to urinate °5. Nausea and/or vomiting °6. Worsening swelling or bruising °7. Continued bleeding from incision. °8. Increased pain, redness, or drainage from the incision ° °The clinic staff is available to answer your questions during regular business hours (8:30am-5pm). Please don’t hesitate to call and ask to speak to one of our nurses for clinical concerns.  °If you have a medical emergency, go to the nearest emergency room or call 911.  °A surgeon from [facility] is always on call at the hospitals  ° °[facility]  °[address], [address], [place], [place] [address] ?  °MAIN: [phone] ? TOLL FREE: [phone] ?  °FAX [phone]  °[email or web address] ° °

## 2015-06-13 NOTE — Discharge Summary (Signed)
[facility] Discharge Summary   Patient ID: Sherry [name] MRN: [id number] DOB/AGE: Jan 26, 1985 30 y.o.  Admit date: 06/10/2015 Discharge date: 06/13/2015  Admitting Diagnosis: Acute appendicitis with rupture  Discharge Diagnosis Patient Active Problem List   Diagnosis Date Noted  . Acute appendicitis with rupture   . Appendicitis, acute, with peritonitis 06/10/2015  . Sepsis (HCC) 06/10/2015  . Acute appendicitis 06/10/2015  . Postpartum care following cesarean delivery (5/24) 06/02/2015  . Postoperative state 06/01/2015  . Seasonal allergies 11/08/2013  . Anxiety state 11/08/2013    Consultants None  Imaging: No results found.  Procedures Dr. Gerrit Friends (06/10/15) - Laparoscopic Appendectomy  Hospital Course:  30 yo white female who was 8 days post-partum from birth of first child by C-section. RLQ pain after discharge home with baby. Developed fever and chills. Seen by obstetrician [facility]) and started on Augmentin for suspected endometritis.  Mild improvement, then increasing fever, shaking chills. EMS called and patient evaluated and transported to [facility] Admissions at [facility]. WBC 2.3. Lactate elevated. CTA positive for probable acute appendicitis with perforation. General surgery contacted and patient transferred to [facility] and admitted to the general surgery service. Zosyn and Vanco started prior to transfer.  Previous left ovarian procedure, recent C-section. Otherwise in good health.  Patient was admitted and underwent procedure listed above.  She was found to have perforated appendicitis with abscess.  A JP drain was placed.  Tolerated procedure well and was transferred to the floor.  Diet was advanced as tolerated.  WBC normalized.  On POD #3, the patient was voiding well, tolerating diet, ambulating well, pain well controlled, vital signs stable, incisions c/d/i and felt stable for discharge home.  JP drain serous and  will be discontinued prior to discharge.  Patient will follow up in our office in 2 weeks and knows to call with questions or concerns.  She will continue 1 additional week of Augmentin to finish her course.  Physical Exam: General:  Alert, NAD, pleasant, comfortable Abd:  Soft, ND, mild tenderness, incisions C/D/I, drain with minimal serous drainage (discontinued)    Medication List    STOP taking these medications        oxyCODONE-acetaminophen 5-325 MG tablet  Commonly known as:  PERCOCET/ROXICET      TAKE these medications        acetaminophen 325 MG tablet  Commonly known as:  TYLENOL  Take 650-975 mg by mouth every 6 (six) hours as needed for mild pain or headache.     amoxicillin-clavulanate 875-125 MG tablet  Commonly known as:  AUGMENTIN  Take 1 tablet by mouth every 12 (twelve) hours.     calcium carbonate 500 MG chewable tablet  Commonly known as:  TUMS - dosed in mg elemental calcium  Chew 1 tablet by mouth as needed for indigestion or heartburn. Patient takes 1 tablet 6-8 times daily.     ferrous sulfate 325 (65 FE) MG tablet  Take 1 tablet (325 mg total) by mouth 2 (two) times daily with a meal.     HYDROcodone-acetaminophen 5-325 MG tablet  Commonly known as:  NORCO/VICODIN  Take 1-2 tablets by mouth every 6 (six) hours as needed for moderate pain.     ibuprofen 800 MG tablet  Commonly known as:  ADVIL,MOTRIN  Take 1 tablet (800 mg total) by mouth every 6 (six) hours as needed for mild pain or cramping.     magnesium oxide 400 (241.3 Mg) MG tablet  Commonly known as:  MAG-OX  Take 1 tablet (400 mg total) by mouth daily.     mupirocin ointment 2 %  Commonly known as:  BACTROBAN  Place 1 application into the nose 2 (two) times daily. Take bottle from room to finish 7 day course     prenatal multivitamin Tabs tablet  Take 1 tablet by mouth daily at 12 noon.         Follow-up Information    [facility] on  06/29/2015.   Specialty:  General Surgery   Why:  For post-operation check. Your appointment is at 9:00am, please arrive at least 30min before your appointment to complete your check in paperwork.  If you are unable to arrive 30min prior to your appointment time we may have to cancel or reschedule you.   Contact information:   [address] [place] [id number] [phone]      Signed: Bradd CanaryMegan N. Alexx Giambra, PA-C [facility] [phone]  06/13/2015, 10:28 AM

## 2015-06-13 NOTE — Progress Notes (Signed)
Pharmacy Antibiotic Note  Sherry [name] is a 30 y.o. female  s/p elective planned C-section for macrosomic baby 5/24.  Inpatient stay unremarkable, discharged 5/27, post-op day 3.  5/31 started having RLQ pain, saw MD in office 6/1 started on PO augmentin for endometritis.  She presented on 06/10/2015 with c/o wornsening pain, fever, chills, vomitting.  Abd CT on 6/2 showed acute appendicitis with possible perf and noted abscess -- s/p appendectomy and drainage of perf appendicitis on 6/2.  Patient's currently on zosyn day #4.  - afeb, wbc down wnl, scr 0.78 (crcl>100) - 1/2 bcx with GNR on gram stain but no organism identified on BCID   Plan: - Zosyn 3.375G IV q8h to be infused over 4 hours - f/u culture  __________________________   Height: 6' (182.9 cm) Weight: 260 lb 14.4 oz (118.343 kg) IBW/kg (Calculated) : 73.1  Temp (24hrs), Avg:99 F (37.2 C), Min:98.1 F (36.7 C), Max:99.5 F (37.5 C)   Recent Labs Lab 06/10/15 1405 06/10/15 1510 06/11/15 0720 06/12/15 0354  WBC 2.3*  --  12.5* 9.7  CREATININE 1.05*  --  0.87 0.78  LATICACIDVEN 3.1* 2.6*  --   --     Estimated Creatinine Clearance: 146.7 mL/min (by C-G formula based on Cr of 0.78).    No Known Allergies  Antimicrobials this admission: 6/2 Vanc >> 6/3 6/2 Zosyn >>   Dose adjustments this admission: n/a  Microbiology results: 6/2 BCx x2: 1/2 GNR, no organism detected on BCID 6/2 UCx: NGF 6/2 Surgical PCR screen: +SA, -MRSA on chlx and bactroban  Thank you for allowing pharmacy to be a part of this patient's care.  Lucia Gaskinsham, Kodie Pick P 06/13/2015 8:27 AM

## 2015-06-13 NOTE — Progress Notes (Signed)
RN reviewed discharge instructions with patient and family. All questions answered.   Paperwork and prescriptions given.   NT rolled patient down in wheelchair with all belongings to family car.    

## 2015-06-14 ENCOUNTER — Encounter (HOSPITAL_COMMUNITY): Payer: Self-pay | Admitting: Lactation Services

## 2015-06-16 LAB — CULTURE, BLOOD (ROUTINE X 2)

## 2015-06-20 ENCOUNTER — Telehealth (HOSPITAL_COMMUNITY): Payer: Self-pay | Admitting: Lactation Services

## 2015-06-20 NOTE — Progress Notes (Signed)
Returned mother's phone call.  Her baby now 598 weeks old was in the NICU.  After discharge she was re-hospitalized for an appendectomy on 6/2.  Has been pumping 3-5 times a day and getting approx 5 oz. Per sesssion.  Suggest she increase her pumping to every 3 hours with the exception of once during the night to give her milk supply a boost.  Mother would like to return to breastfeeding.  Baby mouths nipple but screams when latched.  Set up appt for 6/16 9am.

## 2015-06-24 ENCOUNTER — Ambulatory Visit (HOSPITAL_COMMUNITY)
Admission: RE | Admit: 2015-06-24 | Discharge: 2015-06-24 | Disposition: A | Discharge status: Home / Self Care | Payer: BLUE CROSS/BLUE SHIELD | Source: Ambulatory Visit | Attending: Obstetrics & Gynecology | Admitting: Obstetrics & Gynecology

## 2015-06-24 NOTE — Lactation Note (Signed)
Lactation Consult  Mother's reason for visit:  Help latching to the breast  Visit Type:  Outpatient Appointment Notes:  See below Consult:  Initial Lactation Consultant:  Sherry [name], Sherry [name]  ________________________________________________________________________   Sherry FloresBaby's Name: Sherry [name] Date of Birth: 06/01/2017 Pediatrician: [facility] Gender: female Gestational Age: 2928w0d (At Birth) Birth Weight: 10 lb 14.3 oz (4941 g) Weight at Discharge:  Weight: (!) 10 lb 5.4 oz (4690 g) (reweighed on NICU scale that had been used the previous day) [facility]   06/04/15 1501 06/05/15 0130 06/05/15 1120  Weight: 10 lb 1.6 oz (4580 g) 9 lb 5.8 oz (4247 g) 10 lb 5.4 oz (4690 g)   Weight today: 11 lbs 8.3 oz           Sherry [name] comes in today seeking assistance with latching baby back to the breast.  Sherry [name] is 6524 days old, and has been exclusively bottle fed following 5 day NICU admission at birth.  Sherry [name] also had [facility]  She was placed on IV antibiotics due to appendix was perforated at time of surgery.  Pumping continued during this time, but the frequency wasn't optimal.  3 days ago, Sherry [name] increased her pumping from 3-4 times a day to 6 times a day.  Baby receiving more formula than breast milk, as Mom has been storing the breast milk for later use.  Baby does seem to reflux after feedings, so discussed providing more breast milk now.    Assisted Sherry [name] with latching Sherry [name] using the cross cradle hold.  Initiated a 24 mm nipple shield to assist with transitioning baby from bottle feeding.  Pre-loaded shield with some formula.  When baby was too fussy to latch correctly, 30 ml of formula bottle fed to calm him down.  Baby was able to settle down and latch with a deep areolar latch.  Baby fed for 10 minutes, and then 15 minutes to left breast.  Assisted Mom with use of  alternating breast compression to increase milk transfer during feeding.  Showed Mom how to uncurl lower lip, by pulling gently on chin.  Baby has an upper lip tie, so helping his upper lip flange was shown and successful.  Total transfer was 46 ml.  Plan- 1- Breastfeed using nipple shield, increase frequency at the breast 2-Use breast compression to increase milk flow 3-Offer both breasts at each feeding 4- Offer 1-2 oz after breast (increase breast milk use) 5- Pump 15-20 mins after breastfeeding 6- try to increase frequency of pumping (Power pumping discussed) 8 times a day 7- Follow up with Lactation Consultant Friday 07/01/15 @ 9 am   ________________________________________________________________________  Mother's Name: Sherry [name] Type of delivery:  Cesarean Section Breastfeeding Experience:  Pumping and bottle feeding Maternal Medical Conditions:  appendectomy 10 day post delivery Maternal Medications:  Augmentin and PNV ________________________________________________________________________  Breastfeeding History (Post Discharge) Frequency of breastfeeding:  0 Duration of feeding: 0 Supplementation Formula:  Volume   120 ml Frequency:every 2-3 hrs Total volume per day:  960 ml       Brand: Gentle Ease Breastmilk:  Volume 120 ml Frequency: every 2 X day Total volume per day: 240 ml Method:  Bottle  Pumping Type of pump:  Medela pump in style Frequency: 6 times a day Volume: 120 ml per day (chosing to store all EBM except 240 ml)   Infant Intake and Output Assessment Voids:  8-10 in 24 hrs.  Color:  Clear yellow Stools:  3-4 in 24 hrs.  Color:  Sherry [name] ______________________________________________________________________ Maternal Breast Assessment Breast:  Soft Nipple:  Erect Pain level:  0 Pain interventions:  tylenol prn _______________________________________________________________________ Feeding Assessment/Evaluation Initial feeding  assessment: Infant's oral assessment:  Variance upper lip tie noted Positioning:  Cross cradle Left breast LATCH documentation:  Latch:  2 = Grasps breast easily, tongue down, lips flanged, rhythmical sucking.  Audible swallowing:  2 = Spontaneous and intermittent  Type of nipple:  2 = Everted at rest and after stimulation  Comfort (Breast/Nipple):  2 = Soft / non-tender  Hold (Positioning):  1 = Assistance needed to correctly position infant at breast and maintain latch  LATCH score:  9 Attached assessment:  Deep  Lips flanged:  No.  Lips untucked:  Yes.   upper lip Suck assessment:  Nutritive Tools:  Nipple shield 24 mm Instructed on use and cleaning of tool:  Yes.   Pre-feed weight:  5236 g   Post-feed weight: 5254 g  Amount transferred:  18ml Amount supplemented:  60 ml formula by bottle prior to breast feeding due to baby very fussy and hungry  Additional Feeding Assessment -  Positioning:  Cross cradle Left breast LATCH documentation:  Latch:  2 = Grasps breast easily, tongue down, lips flanged, rhythmical sucking.  Audible swallowing:  2 = Spontaneous and intermittent  Type of nipple:  2 = Everted at rest and after stimulation  Comfort (Breast/Nipple):  2 = Soft / non-tender  Hold (Positioning):  2 = No assistance needed to correctly position infant at breast  LATCH score:  10 Attached assessment:  Deep  Lips flanged:  Yes.    Lips untucked:  Yes.   Suck assessment:  Displays both Tools:  Nipple shield 24 mm Instructed on use and cleaning of tool:  Yes.   Pre-feed weight:  5254 g   Post-feed weight:  5282 g  Amount transferred: 28 ml  Total amount transferred:  46 ml Total supplement given:  60 ml

## 2015-07-01 ENCOUNTER — Ambulatory Visit (HOSPITAL_COMMUNITY): Admission: RE | Admit: 2015-07-01 | Payer: BLUE CROSS/BLUE SHIELD | Source: Ambulatory Visit

## 2017-02-07 IMAGING — CR DG FOOT 2V*L*
2 series · 2 of 2 positions shown · non-contrast
Comparison: None.

CLINICAL DATA: Pain in proximal fifth metatarsal. No known injury.
Patient takes yoga classes. Concern for stress fracture. Symptoms
for 5 months.

EXAM:
LEFT FOOT - 2 VIEW

[x foot ap left]
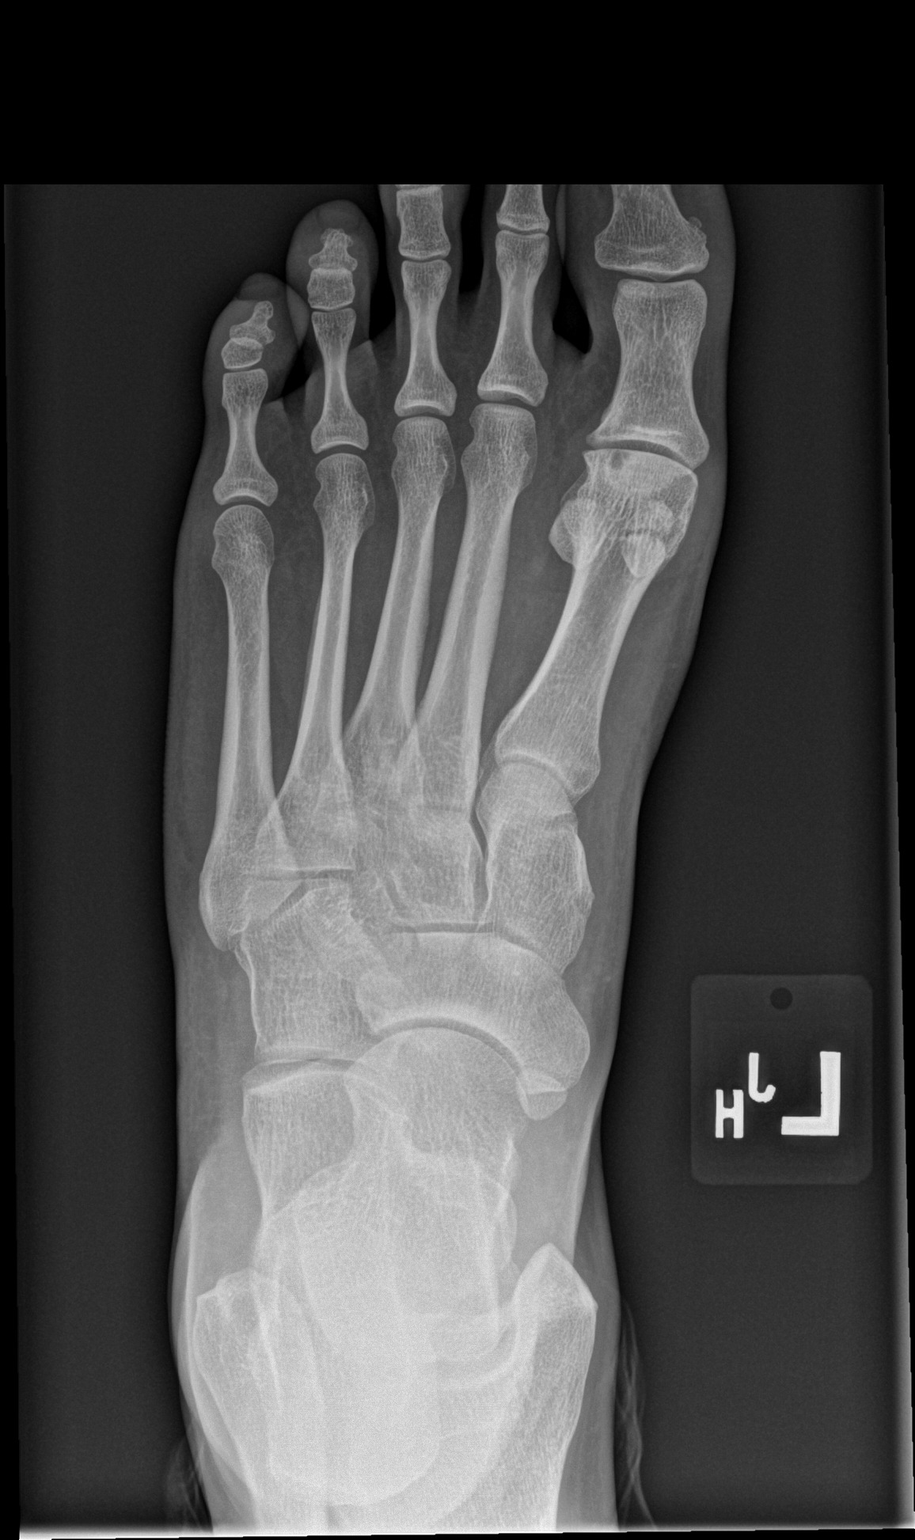

[x foot lat left]
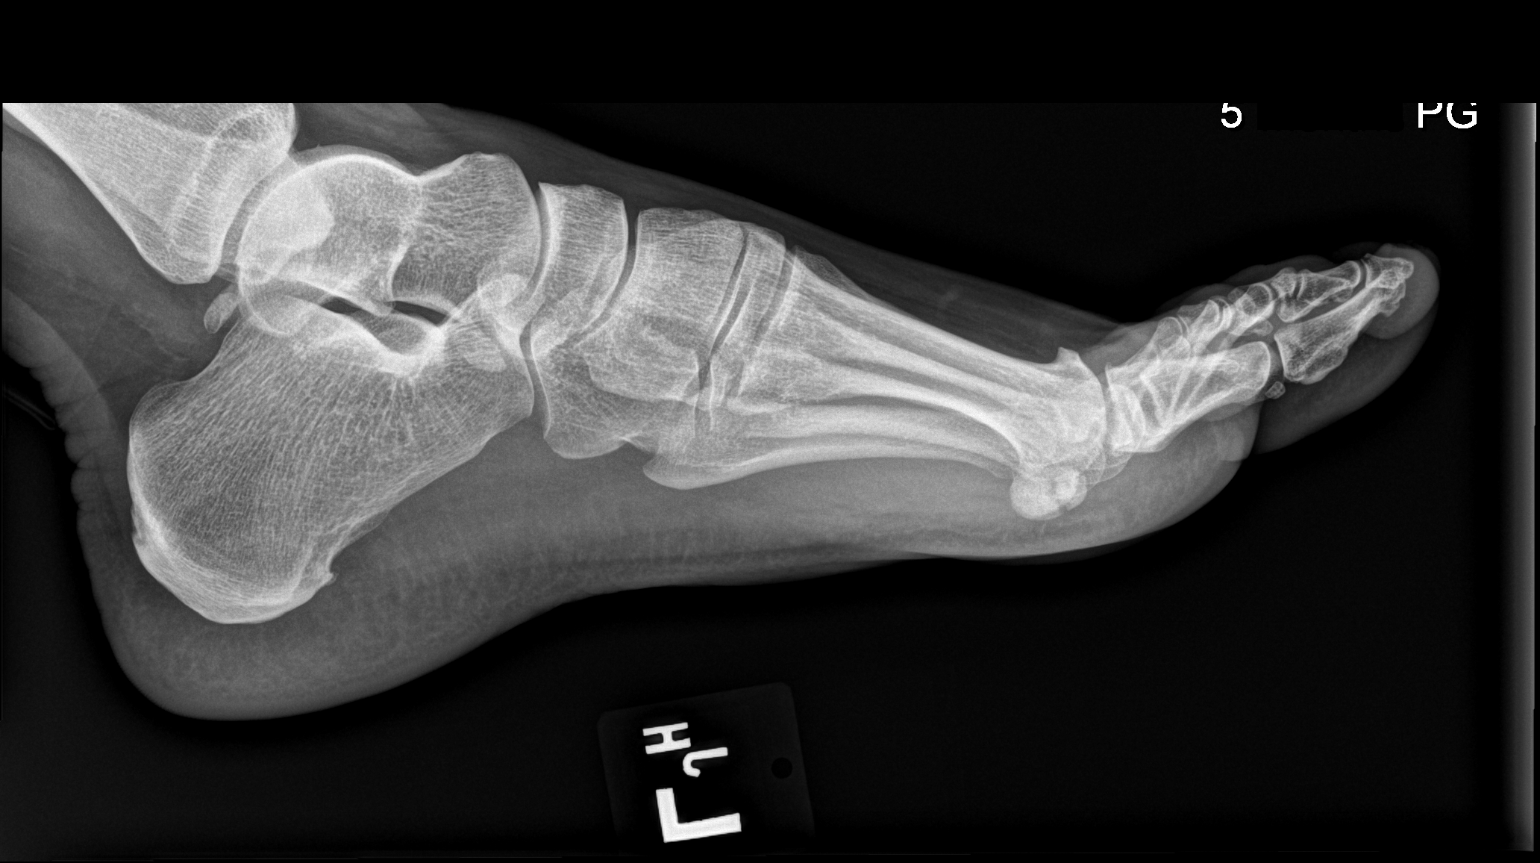

[2 of 2 positions shown; findings below may reference images not displayed]

FINDINGS: There is no evidence of fracture or dislocation. There is no
evidence of arthropathy or other focal bone abnormality. Soft
tissues are unremarkable.
IMPRESSION: Negative.

## 2019-04-07 ENCOUNTER — Other Ambulatory Visit: Payer: Self-pay

## 2019-04-07 ENCOUNTER — Encounter: Payer: Self-pay | Admitting: Obstetrics and Gynecology

## 2019-04-07 ENCOUNTER — Ambulatory Visit (INDEPENDENT_AMBULATORY_CARE_PROVIDER_SITE_OTHER): Payer: 59 | Admitting: Obstetrics and Gynecology

## 2019-04-07 VITALS — BP 122/82 | Ht 70.0 in | Wt 253.0 lb

## 2019-04-07 DIAGNOSIS — L709 Acne, unspecified: Secondary | ICD-10-CM | POA: Diagnosis not present

## 2019-04-07 DIAGNOSIS — Z124 Encounter for screening for malignant neoplasm of cervix: Secondary | ICD-10-CM | POA: Diagnosis not present

## 2019-04-07 DIAGNOSIS — Z01419 Encounter for gynecological examination (general) (routine) without abnormal findings: Secondary | ICD-10-CM

## 2019-04-07 NOTE — Progress Notes (Signed)
Sherry [name] 07/05/1985 [id number]  SUBJECTIVE:  34 y.o. [id number] female for annual routine gynecologic exam and Pap smear. Concerns with increased acne on facial area and changes in menstrual pattern. Still normal and regular frequency, but seems to alternate from light flow some months to heavier flow other months.  Has had mood and abnormal bleeding side effects on OCPs in the past.  Has a 73-year-old boy and he is doing well.  Current Outpatient Medications  Medication Sig Dispense Refill  . acetaminophen (TYLENOL) 325 MG tablet Take 650-975 mg by mouth every 6 (six) hours as needed for mild pain or headache.    . cetirizine (ZYRTEC) 10 MG tablet Take 10 mg by mouth daily.    Marland Kitchen ibuprofen (ADVIL,MOTRIN) 800 MG tablet Take 1 tablet (800 mg total) by mouth every 6 (six) hours as needed for mild pain or cramping. 30 tablet 0   No current facility-administered medications for this visit.   Allergies: Patient has no known allergies.  Patient's last menstrual period was 03/27/2019.  Past medical history,surgical history, problem list, medications, allergies, family history and social history were all reviewed and documented as reviewed in the EPIC chart.  ROS:  Feeling well. No dyspnea or chest pain on exertion.  No abdominal pain, change in bowel habits, black or bloody stools.  No urinary tract symptoms. GYN ROS: normal menses, no abnormal bleeding, pelvic pain or discharge, no breast pain or new or enlarging lumps on self exam. No neurological complaints.  OBJECTIVE:  BP 122/82   Ht 5\' 10"  (1.778 m)   Wt 253 lb (114.8 kg)   LMP 03/27/2019   BMI 36.30 kg/m  The patient appears well, alert, oriented x 3, in no distress. ENT normal.  Neck supple. No cervical or supraclavicular adenopathy or thyromegaly.  Lungs are clear, good air entry, no wheezes, rhonchi or rales. S1 and S2 normal, no murmurs, regular rate and rhythm.  Abdomen soft without tenderness, guarding, mass or organomegaly.    Neurological is normal, no focal findings.  BREAST EXAM: Pendulous breasts appear normal, no suspicious masses, no skin or nipple changes or axillary nodes  PELVIC EXAM: VULVA: normal appearing vulva with no masses, tenderness or lesions, VAGINA: normal appearing vagina with normal color and discharge, no lesions, CERVIX: Best viewed with normal Graves speculum, normal appearing cervix without discharge or lesions, UTERUS: uterus is normal size, shape, consistency and nontender, ADNEXA: normal adnexa in size, nontender and no masses, PAP: Pap smear done today, thin-prep method  Chaperone: 03/29/2019 present during the examination  ASSESSMENT:  34 y.o. 29 here for annual gynecologic exam  PLAN:   1. No menstrual or hormonal concerns.  Slight changes to menstrual pattern and/or quantity are normal as the transition into the later reproductive years occurs.  The patient is reassured by this.  Acne may be exacerbated by frequent use of masks and the current pandemic.  Combined OCPs can be helpful for reducing acne, but she has not tolerated these well before.  She will just monitor symptoms for now. 2. Pap smear collected today.  Current screening guidelines calling for the 3-year Pap smear cytology only interval. 3. Contraception.  She declines need for hormonal contraception at this time.  I encouraged her to monitor for irregularity of menses, and/or or abnormal bleeding and she should let [id number] know.  There are other forms of hormonal contraceptives other than pills that can help with these problems if they should arise, and may be better tolerated.  4. Normal breast exam.  Encouraged breast self awareness and notify us of any concerning changes. 5. Health maintenance.  Routine screening blood work is completed elsewhere.  Return annually or sooner, prn.  Joseph Pierini MD, FACOG  04/07/19

## 2019-04-08 LAB — PAP IG W/ RFLX HPV ASCU

## 2022-04-16 ENCOUNTER — Encounter: Payer: 59 | Admitting: Obstetrics & Gynecology

## 2023-04-15 DIAGNOSIS — H9201 Otalgia, right ear: Secondary | ICD-10-CM | POA: Diagnosis not present

## 2023-04-24 DIAGNOSIS — F909 Attention-deficit hyperactivity disorder, unspecified type: Secondary | ICD-10-CM | POA: Diagnosis not present

## 2023-06-24 ENCOUNTER — Emergency Department

## 2023-06-24 ENCOUNTER — Other Ambulatory Visit: Payer: Self-pay

## 2023-06-24 ENCOUNTER — Emergency Department
Admission: EM | Admit: 2023-06-24 | Discharge: 2023-06-25 | Disposition: A | Discharge status: Home / Self Care | Attending: Emergency Medicine | Admitting: Emergency Medicine

## 2023-06-24 DIAGNOSIS — K760 Fatty (change of) liver, not elsewhere classified: Secondary | ICD-10-CM | POA: Insufficient documentation

## 2023-06-24 DIAGNOSIS — R0789 Other chest pain: Secondary | ICD-10-CM | POA: Diagnosis not present

## 2023-06-24 DIAGNOSIS — R079 Chest pain, unspecified: Secondary | ICD-10-CM

## 2023-06-24 DIAGNOSIS — R110 Nausea: Secondary | ICD-10-CM | POA: Diagnosis not present

## 2023-06-24 DIAGNOSIS — R1010 Upper abdominal pain, unspecified: Secondary | ICD-10-CM | POA: Diagnosis not present

## 2023-06-24 LAB — BASIC METABOLIC PANEL WITH GFR
Anion gap: 13 (ref 5–15)
BUN: 8 mg/dL (ref 6–20)
CO2: 21 mmol/L — ABNORMAL LOW (ref 22–32)
Calcium: 9.6 mg/dL (ref 8.9–10.3)
Chloride: 104 mmol/L (ref 98–111)
Creatinine, Ser: 0.78 mg/dL (ref 0.44–1.00)
GFR, Estimated: >60 mL/min (ref >60)
Glucose, Bld: 97 mg/dL (ref 70–99)
Potassium: 3.8 mmol/L (ref 3.5–5.1)
Sodium: 138 mmol/L (ref 135–145)

## 2023-06-24 LAB — CBC
HCT: 45.7 % (ref 36.0–46.0)
Hemoglobin: 15.6 g/dL — ABNORMAL HIGH (ref 12.0–15.0)
MCH: 31.3 pg (ref 26.0–34.0)
MCHC: 34.1 g/dL (ref 30.0–36.0)
MCV: 91.8 fL (ref 80.0–100.0)
Platelets: 240 10*3/uL (ref 150–400)
RBC: 4.98 MIL/uL (ref 3.87–5.11)
RDW: 13.2 % (ref 11.5–15.5)
WBC: 8.8 10*3/uL (ref 4.0–10.5)
nRBC: 0 % (ref 0.0–0.2)

## 2023-06-24 LAB — TROPONIN I (HIGH SENSITIVITY): Troponin I (High Sensitivity): 4 ng/L (ref <18)

## 2023-06-24 LAB — POC URINE PREG, ED: Preg Test, Ur: NEGATIVE

## 2023-06-24 MED ORDER — PANTOPRAZOLE SODIUM 40 MG IV SOLR
40.0000 mg | Freq: Once | INTRAVENOUS | Status: AC
  Administered 2023-06-25: 40 mg via INTRAVENOUS
  Filled 2023-06-24: qty 10

## 2023-06-24 MED ORDER — ONDANSETRON HCL 4 MG/2ML IJ SOLN
4.0000 mg | Freq: Once | INTRAMUSCULAR | Status: AC
  Administered 2023-06-25: 4 mg via INTRAVENOUS
  Filled 2023-06-24: qty 2

## 2023-06-24 MED ORDER — SODIUM CHLORIDE 0.9 % IV BOLUS (SEPSIS)
1000.0000 mL | Freq: Once | INTRAVENOUS | Status: AC
  Administered 2023-06-25: 1000 mL via INTRAVENOUS

## 2023-06-24 MED ORDER — KETOROLAC TROMETHAMINE 30 MG/ML IJ SOLN
30.0000 mg | Freq: Once | INTRAMUSCULAR | Status: AC
  Administered 2023-06-25: 30 mg via INTRAVENOUS
  Filled 2023-06-24: qty 1

## 2023-06-24 NOTE — ED Triage Notes (Addendum)
 Pt comes with cp and nausea. Pt states pain in back all day. Pt states she has not felt well today. Pt denies any radiation to arms. Pt states mid back pain. Pt states her heart just hasn't felt right.   Pt had went to [facility] and they did EKG and then sent pt here. Pt was given 325 aspirin at [facility]

## 2023-06-24 NOTE — ED Provider Notes (Signed)
 [facility] Provider Note    Event Date/Time   First MD Initiated Contact with Patient 06/24/23 2326     (approximate)   History   Chest Pain   HPI  Sherry [name] is a 38 y.o. female with history of anxiety who presents to the emergency department with complaints of chest discomfort, shortness of breath, occasional nausea, feeling like her heart is dropping.  Also having some upper abdominal pain that radiates into her back.  Went to urgent care today and they performed an EKG which showed inferior Q waves and they were concerned for cardiac chest pain and recommended she come to the ED.  No fevers, cough, vomiting, lower extremity swelling or pain.  No history of PE or DVT.  No known history of gallstones.   History provided by patient, husband.    Past Medical History:  Diagnosis Date   Allergy    Anxiety    Postpartum care following cesarean delivery (5/24) 06/02/2015    Past Surgical History:  Procedure Laterality Date   CESAREAN SECTION N/A 06/01/2015   Procedure: PRIMARY CESAREAN SECTION;  Surgeon: Marie-Lyne Lavoie, MD;  Location: [facility];  Service: Obstetrics;  Laterality: N/A;   CHROMOPERTUBATION N/A 04/22/2014   Procedure: CHROMOPERTUBATION;  Surgeon: Marie-Lyne Lavoie, MD;  Location: [facility];  Service: Gynecology;  Laterality: N/A;  Fallopian Tubes   LAPAROSCOPIC APPENDECTOMY N/A 06/10/2015   Procedure: APPENDECTOMY LAPAROSCOPIC;  Surgeon: Oralee Billow, MD;  Location: [facility];  Service: General;  Laterality: N/A;   NO PAST SURGERIES     ROBOTIC ASSISTED LAPAROSCOPIC OVARIAN CYSTECTOMY Left 04/22/2014   Procedure: ROBOTIC ASSISTED LAPAROSCOPIC OVARIAN CYSTECTOMY;  Surgeon: Percy Bracken, MD;  Location: [facility];  Service: Gynecology;  Laterality: Left;    MEDICATIONS:  Prior to Admission medications   Medication Sig Start Date End Date Taking? Authorizing Provider  acetaminophen  (TYLENOL ) 325 MG tablet Take 650-975 mg by mouth  every 6 (six) hours as needed for mild pain or headache.    [provider]  cetirizine (ZYRTEC) 10 MG tablet Take 10 mg by mouth daily.    [provider]  ibuprofen  (ADVIL ,MOTRIN ) 800 MG tablet Take 1 tablet (800 mg total) by mouth every 6 (six) hours as needed for mild pain or cramping. 06/04/15   Almond Army, CNM    Physical Exam   Triage Vital Signs: ED Triage Vitals  Encounter Vitals Group     BP 06/24/23 1706 (!) 156/97     Girls Systolic BP Percentile --      Girls Diastolic BP Percentile --      Boys Systolic BP Percentile --      Boys Diastolic BP Percentile --      Pulse Rate 06/24/23 1706 77     Resp 06/24/23 1706 18     Temp 06/24/23 1706 98 F (36.7 C)     Temp src --      SpO2 06/24/23 1706 100 %     Weight 06/24/23 1706 250 lb (113.4 kg)     Height 06/24/23 1706 5' 11 (1.803 m)     Head Circumference --      Peak Flow --      Pain Score 06/24/23 1704 5     Pain Loc --      Pain Education --      Exclude from Growth Chart --     Most recent vital signs: Vitals:   06/24/23 2354 06/25/23 0326  BP: Aaron Aas)  159/96 (!) 162/95  Pulse: 64 70  Resp: 20 16  Temp: 98.3 F (36.8 C) 98.3 F (36.8 C)  SpO2: 99% 100%    CONSTITUTIONAL: Alert, responds appropriately to questions. Well-appearing; well-nourished, anxious, tearful HEAD: Normocephalic, atraumatic EYES: Conjunctivae clear, pupils appear equal, sclera nonicteric ENT: normal nose; moist mucous membranes NECK: Supple, normal ROM CARD: RRR; S1 and S2 appreciated RESP: Normal chest excursion without splinting or tachypnea; breath sounds clear and equal bilaterally; no wheezes, no rhonchi, no rales, no hypoxia or respiratory distress, speaking full sentences ABD/GI: Non-distended; soft, non-tender, no rebound, no guarding, no peritoneal signs BACK: The back appears normal EXT: Normal ROM in all joints; no deformity noted, no edema, no calf tenderness or calf swelling SKIN: Normal color for  age and race; warm; no rash on exposed skin NEURO: Moves all extremities equally, normal speech PSYCH: The patient's mood and manner are appropriate.   ED Results / Procedures / Treatments   LABS: (all labs ordered are listed, but only abnormal results are displayed) Labs Reviewed  BASIC METABOLIC PANEL WITH GFR - Abnormal; Notable for the following components:      Result Value   CO2 21 (*)    All other components within normal limits  CBC - Abnormal; Notable for the following components:   Hemoglobin 15.6 (*)    All other components within normal limits  D-DIMER, QUANTITATIVE  HEPATIC FUNCTION PANEL  LIPASE, BLOOD  POC URINE PREG, ED  TROPONIN I (HIGH SENSITIVITY)  TROPONIN I (HIGH SENSITIVITY)     EKG:  EKG Interpretation Date/Time:  Monday June 24 2023 17:11:00 EDT Ventricular Rate:  63 PR Interval:  146 QRS Duration:  78 QT Interval:  416 QTC Calculation: 425 R Axis:   -15  Text Interpretation: Normal sinus rhythm Cannot rule out Inferior infarct , age undetermined Cannot rule out Anterior infarct , age undetermined Abnormal ECG When compared with ECG of 10-Jun-2015 13:32, Vent. rate has decreased BY  88 BPM Minimal criteria for Inferior infarct are now Present Nonspecific T wave abnormality, improved in Lateral leads Confirmed by Verneda Golder [phone]) on 06/24/2023 11:54:00 PM         RADIOLOGY: My personal review and interpretation of imaging: Chest x-ray clear.  Right upper quadrant ultrasound shows normal gallbladder.  I have personally reviewed all radiology reports.   US  ABDOMEN LIMITED RUQ (LIVER/GB) Result Date: 06/25/2023 CLINICAL DATA:  Upper abdominal pain. EXAM: ULTRASOUND ABDOMEN LIMITED RIGHT UPPER QUADRANT COMPARISON:  None Available. FINDINGS: Gallbladder: No gallstones or wall thickening visualized (2.1 mm). No sonographic Murphy sign noted by sonographer. Common bile duct: Diameter: 4.0 mm Liver: No focal lesion identified. Diffusely increased  echogenicity of the liver parenchyma is noted. Portal vein is patent on color Doppler imaging with normal direction of blood flow towards the liver. Other: None. IMPRESSION: Hepatic steatosis. Electronically Signed   By: Virgle Grime M.D.   On: 06/25/2023 01:39   DG Chest 2 View Result Date: 06/24/2023 CLINICAL DATA:  Chest pain rating to the back with nausea. EXAM: CHEST - 2 VIEW COMPARISON:  03/06/2009. FINDINGS: Trachea is midline. Heart size normal. Lungs are clear. No pleural fluid. IMPRESSION: Negative. Electronically Signed   By: Shearon Denis M.D.   On: 06/24/2023 17:50     PROCEDURES:  Critical Care performed: No     .1-3 Lead EKG Interpretation  Performed by: Zi Newbury, Clover Dao, DO Authorized by: Olivia Royse, Clover Dao, DO     Interpretation: normal     ECG  rate:  70   ECG rate assessment: normal     Rhythm: sinus rhythm     Ectopy: none     Conduction: normal       IMPRESSION / MDM / ASSESSMENT AND PLAN / ED COURSE  I reviewed the triage vital signs and the nursing notes.    Patient here for chest pain, shortness of breath, upper abdominal pain, nausea.  The patient is on the cardiac monitor to evaluate for evidence of arrhythmia and/or significant heart rate changes.   DIFFERENTIAL DIAGNOSIS (includes but not limited to):   ACS, PE, GERD, gastritis, cholelithiasis, cholecystitis, electrolyte derangement, anemia, doubt pneumonia, pneumothorax, dissection   Patient's presentation is most consistent with acute presentation with potential threat to life or bodily function.   PLAN: Normal hemoglobin.  Normal electrolytes.  First troponin negative.  Pregnancy test negative.  Will obtain second troponin, D-dimer.  Will also obtain LFTs, lipase given upper abdominal pain as well as a right upper quadrant ultrasound.  No tenderness at McBurney's point.  No urinary symptoms.  Will give Toradol , fluids, Zofran  and reassess.   MEDICATIONS GIVEN IN ED: Medications  sodium  chloride 0.9 % bolus 1,000 mL (0 mLs Intravenous Stopped 06/25/23 0326)  ondansetron  (ZOFRAN ) injection 4 mg (4 mg Intravenous Given 06/25/23 0151)  ketorolac  (TORADOL ) 30 MG/ML injection 30 mg (30 mg Intravenous Given 06/25/23 0151)  pantoprazole (PROTONIX) injection 40 mg (40 mg Intravenous Given 06/25/23 0150)     ED COURSE: Patient reports she is feeling better.  Second troponin negative.  D-dimer negative.  Chest x-ray, right upper quadrant ultrasound reviewed and interpreted by myself and the radiologist and are unremarkable.  She does have hepatic steatosis.  Discussed this with patient and recommended follow-up with her PCP.  Given still having intermittent episodes of where she feels like her heart is dropping, have recommended follow-up with her PCP and as well as cardiology potentially for a echocardiogram and Holter monitoring.  Will place cardiology referral.  Seems atypical for ACS but we did discuss that arrhythmia is on the differential although we have not seen any abnormal rhythms here in the ED.  I feel she is safe for discharge home.  Patient comfortable with this plan.   At this time, I do not feel there is any life-threatening condition present. I reviewed all nursing notes, vitals, pertinent previous records.  All lab and urine results, EKGs, imaging ordered have been independently reviewed and interpreted by myself.  I reviewed all available radiology reports from any imaging ordered this visit.  Based on my assessment, I feel the patient is safe to be discharged home without further emergent workup and can continue workup as an outpatient as needed. Discussed all findings, treatment plan as well as usual and customary return precautions.  They verbalize understanding and are comfortable with this plan.  Outpatient follow-up has been provided as needed.  All questions have been answered.    CONSULTS:  none   OUTSIDE RECORDS REVIEWED: Reviewed urgent care notes.  Reviewed last  gynecology note in 2021.       FINAL CLINICAL IMPRESSION(S) / ED DIAGNOSES   Final diagnoses:  Nonspecific chest pain  Hepatic steatosis     Rx / DC Orders   ED Discharge Orders          Ordered    Ambulatory referral to Cardiology       Comments: If you have not heard from the Cardiology office within the next 72 hours please  call [phone].   06/25/23 0315    oxyCODONE  (ROXICODONE ) 5 MG immediate release tablet  Every 8 hours PRN        06/25/23 0316             Note:  This document was prepared using Dragon voice recognition software and may include unintentional dictation errors.   Tamarah Bhullar, Clover Dao, DO 06/25/23 0500

## 2023-06-24 NOTE — ED Provider Triage Note (Signed)
 Emergency Medicine Provider Triage Evaluation Note  Sherry [name] , a 38 y.o. female  was evaluated in triage.  Pt complains of chest pain, radiates to the back, nausea.  Patient states having these episodes every 6 months,.  This episode is lasting all day long.  Patient has history of prolapse of mitral valve.  States she is not feeling right.  Denies any radiation of the pain to the left jaw, left arm.  Patient denies history of hypertension, is not taking antihypertensives.   Review of Systems  Positive:  Negative:   Physical Exam  BP (!) 156/97   Pulse 77   Temp 98 F (36.7 C)   Resp 18   Ht 5' 11 (1.803 m)   Wt 113.4 kg   LMP 06/17/2023   SpO2 100%   BMI 34.87 kg/m during triage patient is hypertensive. Gen:   Awake, no distress   Resp:  Normal effort  MSK:   Moves extremities without difficulty Other:    Medical Decision Making  Medically screening exam initiated at 5:07 PM.  Appropriate orders placed.  Sherry [name] was informed that the remainder of the evaluation will be completed by another provider, this initial triage assessment does not replace that evaluation, and the importance of remaining in the ED until their evaluation is complete.  Patient who presents today with chest pain that radiates to the back, nauseous, no history of hypertension.  She has history of mitral valve prolapse.  Patient states no feeling the same today.  Family history of heart pathology.  Ordered CBC CMP EKG checks x-ray troponins   Sherry Lennox, PA-C 06/24/23 1710

## 2023-06-25 ENCOUNTER — Emergency Department

## 2023-06-25 DIAGNOSIS — R1010 Upper abdominal pain, unspecified: Secondary | ICD-10-CM | POA: Diagnosis not present

## 2023-06-25 DIAGNOSIS — K760 Fatty (change of) liver, not elsewhere classified: Secondary | ICD-10-CM | POA: Diagnosis not present

## 2023-06-25 LAB — HEPATIC FUNCTION PANEL
ALT: 40 U/L (ref 0–44)
AST: 23 U/L (ref 15–41)
Albumin: 4.4 g/dL (ref 3.5–5.0)
Alkaline Phosphatase: 114 U/L (ref 38–126)
Bilirubin, Direct: 0.1 mg/dL (ref 0.0–0.2)
Indirect Bilirubin: 0.8 mg/dL (ref 0.3–0.9)
Total Bilirubin: 0.9 mg/dL (ref 0.0–1.2)
Total Protein: 7.2 g/dL (ref 6.5–8.1)

## 2023-06-25 LAB — TROPONIN I (HIGH SENSITIVITY): Troponin I (High Sensitivity): 5 ng/L (ref <18)

## 2023-06-25 LAB — D-DIMER, QUANTITATIVE: D-Dimer, Quant: <0.27 ug{FEU}/mL (ref 0.00–0.50)

## 2023-06-25 LAB — LIPASE, BLOOD: Lipase: 42 U/L (ref 11–51)

## 2023-06-25 MED ORDER — OXYCODONE HCL 5 MG PO TABS: 5.0000 mg | ORAL_TABLET | Freq: Three times a day (TID) | ORAL | 0 refills | Status: DC | PRN

## 2023-06-25 NOTE — Discharge Instructions (Addendum)
You may alternate Tylenol 1000 mg every 6 hours as needed for pain, fever and Ibuprofen 800 mg every 6-8 hours as needed for pain, fever.  Please take Ibuprofen with food.  Do not take more than 4000 mg of Tylenol (acetaminophen) in a 24 hour period. ° °

## 2023-07-01 DIAGNOSIS — F909 Attention-deficit hyperactivity disorder, unspecified type: Secondary | ICD-10-CM | POA: Diagnosis not present

## 2023-07-01 DIAGNOSIS — H9202 Otalgia, left ear: Secondary | ICD-10-CM | POA: Diagnosis not present

## 2023-07-01 DIAGNOSIS — E669 Obesity, unspecified: Secondary | ICD-10-CM | POA: Diagnosis not present

## 2023-07-01 DIAGNOSIS — R079 Chest pain, unspecified: Secondary | ICD-10-CM | POA: Diagnosis not present

## 2023-07-11 ENCOUNTER — Ambulatory Visit

## 2023-07-11 VITALS — BP 124/90 | HR 82 | Ht 69.0 in | Wt 244.8 lb

## 2023-07-11 DIAGNOSIS — R030 Elevated blood-pressure reading, without diagnosis of hypertension: Secondary | ICD-10-CM | POA: Insufficient documentation

## 2023-07-11 DIAGNOSIS — R079 Chest pain, unspecified: Secondary | ICD-10-CM | POA: Diagnosis not present

## 2023-07-11 DIAGNOSIS — R002 Palpitations: Secondary | ICD-10-CM | POA: Insufficient documentation

## 2023-07-11 NOTE — Progress Notes (Signed)
 Cardiology Consultation:    Date:  07/11/2023   ID:  Sherry [name], DOB [birth year], MRN [id number]  PCP:  Sun, Vyvyan, MD  Cardiologist:  Alean SAUNDERS Dezman Granda, MD   Referring MD: Ward, Josette SAILOR, DO   No chief complaint on file.    ASSESSMENT AND PLAN:   Ms. Pescador 38 year old woman with no significant prior cardiac history.  Uses Adderall on occasions to help with her ADHD symptoms.  Problem List Items Addressed This Visit     Chest pain of uncertain etiology - Primary   Her chest discomfort symptoms are atypical and appears more consistent with palpitations that were for 2 to 3 days around the time of her visit to the urgent care facility in the ER.  EKG findings are nonspecific for Q wave changes in lead II otherwise normal EKG, does not appear pathological.  She has good functional capacity.  However no regular exercise. Advised to increase her activity as tolerated and if notices any exertional symptoms to let us  know.  At this time given the nature of her symptoms I do not see need for ischemic workup.       Relevant Orders   ECHOCARDIOGRAM COMPLETE   Elevated blood pressure reading without diagnosis of hypertension   Evaded blood pressure readings at her recent urgent care and ER visit. Subsequent blood pressure measurements at home have been reportedly normal. Here in the clinic blood pressure diastolic elevated 90 mmHg.  Advised to monitor blood pressures at home regularly and see if there is a pattern with the days she uses the Adderall.        Palpitations   Palpitations infrequent rare episodes over the years, with more frequent episodes 3 weeks ago, lasting for couple days. Have subsided since then along with her GI symptoms.  Likely related to an acute gastrointestinal infection.  Given longstanding history of palpitations on and off we will proceed with Zio patch for 2 weeks.  Suggested options for home monitoring with devices such as Kardia  mobile or smart watches.  Will also obtain structural and functional assessment of the heart with an echocardiogram, in light of her symptoms and family history of mitral valve prolapse.      Relevant Orders   LONG TERM MONITOR (3-14 DAYS)   Return to clinic based on test results   History of Present Illness:    Sherry [name] is a 38 y.o. female who is being seen today for the evaluation of chest pain at the request of Ward, Kristen N, DO.  Pleasant woman works in [employer].  Lives with her husband at home.  No significant chronic cardiac health issues.  Has ADHD for which she uses Adderall, mostly on an as-needed basis.  Recently had a visit to the urgent care facility 06/24/2023 for symptoms of chest discomfort/palpitations, blood pressures were elevated 156/93 mmHg, and there was concern about inferior lead Q waves subsequently evaluated at emergency room at [facility], blood pressures remained elevated at the ER 159/96 mmHg, ultrasound abdomen right upper quadrant given the epigastric discomfort was conducted and noted hepatic steatosis.  High-sensitivity troponins x 2 were unremarkable, D-dimer was unremarkable, not pregnant.  LFTs were noted to be normal, CBC and BMP were unremarkable.  EKG was similar with sinus rhythm and Q waves in lead II.  Return to home as no acute issues, notes symptoms of nausea and vomiting lasted for another couple days and eased up over  a week.  Her symptoms of palpitations subsided gradually.  Mentions no significant symptoms over the last week or two.  Describes it as a sensation of skipped beat or thump like sensation in the chest that occurs randomly.  Describes the sensation has been going on for many years and had noticed an increased pattern of this when she consumes beer and hence discontinued beer consumption.  The symptoms used to occur infrequently once or twice a year.  Recently on June 16 she woke up in  the morning, which is not uncommon and attributes that to insomnia.  However she also noticed vague abdominal symptoms and subsequently noted thumping beat sensations occurring in the heart which last for a moment or 2.  As they occur more frequently through her workday she went into the urgent care facility and.  Overall at this time she has been doing well. Does not smoke. Has stopped drinking beer. Drinks 1 or 2 cups of coffee a day. Has stopped drinking colas and sodas.  Father with history of mitral valve prolapse requiring surgery. Mother with history of palpitations requiring ablation.  Past Medical History:  Diagnosis Date   Allergy    Anxiety    Postpartum care following cesarean delivery (5/24) 06/02/2015    Past Surgical History:  Procedure Laterality Date   CESAREAN SECTION N/A 06/01/2015   Procedure: PRIMARY CESAREAN SECTION;  Surgeon: Marie-Lyne Lavoie, MD;  Location: [facility];  Service: Obstetrics;  Laterality: N/A;   CHROMOPERTUBATION N/A 04/22/2014   Procedure: CHROMOPERTUBATION;  Surgeon: Marie-Lyne Lavoie, MD;  Location: [facility];  Service: Gynecology;  Laterality: N/A;  Fallopian Tubes   LAPAROSCOPIC APPENDECTOMY N/A 06/10/2015   Procedure: APPENDECTOMY LAPAROSCOPIC;  Surgeon: Krystal Spinner, MD;  Location: [facility];  Service: General;  Laterality: N/A;   NO PAST SURGERIES     ROBOTIC ASSISTED LAPAROSCOPIC OVARIAN CYSTECTOMY Left 04/22/2014   Procedure: ROBOTIC ASSISTED LAPAROSCOPIC OVARIAN CYSTECTOMY;  Surgeon: Percilla Burly, MD;  Location: [facility];  Service: Gynecology;  Laterality: Left;    Current Medications: Current Meds  Medication Sig   acetaminophen  (TYLENOL ) 325 MG tablet Take 650-975 mg by mouth every 6 (six) hours as needed for mild pain or headache.   amphetamine-dextroamphetamine (ADDERALL XR) 15 MG 24 hr capsule TAKE 1 CAPSULE BY MOUTH IN THE MORNING ONCE DAILY FOR 30 DAYS   cetirizine (ZYRTEC) 10 MG tablet Take 10 mg by mouth daily.   ibuprofen   (ADVIL ,MOTRIN ) 800 MG tablet Take 1 tablet (800 mg total) by mouth every 6 (six) hours as needed for mild pain or cramping.     Allergies:   Patient has no known allergies.   Social History   Socioeconomic History   Marital status: Married    Spouse name: Not on file   Number of children: Not on file   Years of education: Not on file   Highest education level: Not on file  Occupational History   Not on file  Tobacco Use   Smoking status: Never   Smokeless tobacco: Never  Vaping Use   Vaping status: Never Used  Substance and Sexual Activity   Alcohol use: Yes    Alcohol/week: 2.0 - 3.0 standard drinks of alcohol    Types: 2 - 3 Glasses of wine per week   Drug use: No   Sexual activity: Yes    Birth control/protection: Condom    Comment: 1st intercourse 38 yo-Fewer than 5 partners  Other Topics Concern   Not on file  Social History  Narrative   Not on file   Social Drivers of Health   Financial Resource Strain: Not on file  Food Insecurity: Not on file  Transportation Needs: Not on file  Physical Activity: Not on file  Stress: Not on file  Social Connections: Not on file     Family History: The patient's family history includes Cancer in her father; Depression in her mother; Stroke in her father; Varicose Veins in her mother. ROS:   Please see the history of present illness.    All 14 point review of systems negative except as described per history of present illness.  EKGs/Labs/Other Studies Reviewed:    The following studies were reviewed today:   EKG:       Recent Labs: 06/24/2023: ALT 40; BUN 8; Creatinine, Ser 0.78; Hemoglobin 15.6; Platelets 240; Potassium 3.8; Sodium 138  Recent Lipid Panel No results found for: CHOL, TRIG, HDL, CHOLHDL, VLDL, LDLCALC, LDLDIRECT  Physical Exam:    VS:  BP (!) 124/90 (BP Location: Right Arm, Patient Position: Sitting)   Pulse 82   Ht 5' 9 (1.753 m)   Wt 244 lb 12.8 oz (111 kg)   LMP 06/17/2023    SpO2 99%   BMI 36.15 kg/m     Wt Readings from Last 3 Encounters:  07/11/23 244 lb 12.8 oz (111 kg)  06/24/23 250 lb (113.4 kg)  04/07/19 253 lb (114.8 kg)     GENERAL:  Well nourished, well developed in no acute distress NECK: No JVD; No carotid bruits CARDIAC: RRR, S1 and S2 present, no murmurs, no rubs, no gallops CHEST:  Clear to auscultation without rales, wheezing or rhonchi  Extremities: No pitting pedal edema. Pulses bilaterally symmetric with radial 2+ and dorsalis pedis 2+ NEUROLOGIC:  Alert and oriented x 3  Medication Adjustments/Labs and Tests Ordered: Current medicines are reviewed at length with the patient today.  Concerns regarding medicines are outlined above.  Orders Placed This Encounter  Procedures   LONG TERM MONITOR (3-14 DAYS)   ECHOCARDIOGRAM COMPLETE   No orders of the defined types were placed in this encounter.   Signed, Alean jess Kobus, MD, MPH, [facility]. 07/11/2023 9:08 AM    [facility]

## 2023-07-11 NOTE — Patient Instructions (Signed)
 Medication Instructions:  Your physician recommends that you continue on your current medications as directed. Please refer to the Current Medication list given to you today.  *If you need a refill on your cardiac medications before your next appointment, please call your pharmacy*   Lab Work: None ordered If you have labs (blood work) drawn today and your tests are completely normal, you will receive your results only by: MyChart Message (if you have MyChart) OR A paper copy in the mail If you have any lab test that is abnormal or we need to change your treatment, we will call you to review the results.  Testing/Procedures: Your physician has requested that you have an echocardiogram. Echocardiography is a painless test that uses sound waves to create images of your heart. It provides your doctor with information about the size and shape of your heart and how well your heart's chambers and valves are working. This procedure takes approximately one hour. There are no restrictions for this procedure. Please do NOT wear cologne, perfume, aftershave, or lotions (deodorant is allowed). Please arrive 15 minutes prior to your appointment time.  Please note: We ask at that you not bring children with you during ultrasound (echo/ vascular) testing. Due to room size and safety concerns, children are not allowed in the ultrasound rooms during exams. Our front office staff cannot provide observation of children in our lobby area while testing is being conducted. An adult accompanying a patient to their appointment will only be allowed in the ultrasound room at the discretion of the ultrasound technician under special circumstances. We apologize for any inconvenience.  A zio monitor was ordered today. It will remain on for 14 days. Remove 07/25/23. You will then return monitor and event diary in provided box. It takes 1-2 weeks for report to be downloaded and returned to us . We will call you with the results.  If monitor falls off or has orange flashing light, please call Zio for further instructions.   Follow-Up: At [facility], you and your health needs are our priority.  As part of our continuing mission to provide you with exceptional heart care, we have created designated Provider Care Teams.  These Care Teams include your primary Cardiologist (physician) and Advanced Practice Providers (APPs -  Physician Assistants and Nurse Practitioners) who all work together to provide you with the care you need, when you need it.  We recommend signing up for the patient portal called MyChart.  Sign up information is provided on this After Visit Summary.  MyChart is used to connect with patients for Virtual Visits (Telemedicine).  Patients are able to view lab/test results, encounter notes, upcoming appointments, etc.  Non-urgent messages can be sent to your provider as well.   To learn more about what you can do with MyChart, go to [email or web address].    Your next appointment:   As needed  The format for your next appointment:   In Person  Provider:   Alean Madireddy, MD   Other Instructions Echocardiogram An echocardiogram is a test that uses sound waves (ultrasound) to produce images of the heart. Images from an echocardiogram can provide important information about: Heart size and shape. The size and thickness and movement of your heart's walls. Heart muscle function and strength. Heart valve function or if you have stenosis. Stenosis is when the heart valves are too narrow. If blood is flowing backward through the heart valves (regurgitation). A tumor or infectious growth around the heart valves. Areas of heart  muscle that are not working well because of poor blood flow or injury from a heart attack. Aneurysm detection. An aneurysm is a weak or damaged part of an artery wall. The wall bulges out from the normal force of blood pumping through the body. Tell a health care provider  about: Any allergies you have. All medicines you are taking, including vitamins, herbs, eye drops, creams, and over-the-counter medicines. Any blood disorders you have. Any surgeries you have had. Any medical conditions you have. Whether you are pregnant or may be pregnant. What are the risks? Generally, this is a safe test. However, problems may occur, including an allergic reaction to dye (contrast) that may be used during the test. What happens before the test? No specific preparation is needed. You may eat and drink normally. What happens during the test? You will take off your clothes from the waist up and put on a hospital gown. Electrodes or electrocardiogram (ECG)patches may be placed on your chest. The electrodes or patches are then connected to a device that monitors your heart rate and rhythm. You will lie down on a table for an ultrasound exam. A gel will be applied to your chest to help sound waves pass through your skin. A handheld device, called a transducer, will be pressed against your chest and moved over your heart. The transducer produces sound waves that travel to your heart and bounce back (or echo back) to the transducer. These sound waves will be captured in real-time and changed into images of your heart that can be viewed on a video monitor. The images will be recorded on a computer and reviewed by your health care provider. You may be asked to change positions or hold your breath for a short time. This makes it easier to get different views or better views of your heart. In some cases, you may receive contrast through an IV in one of your veins. This can improve the quality of the pictures from your heart. The procedure may vary among health care providers and hospitals.   What can I expect after the test? You may return to your normal, everyday life, including diet, activities, and medicines, unless your health care provider tells you not to do that. Follow these  instructions at home: It is up to you to get the results of your test. Ask your health care provider, or the department that is doing the test, when your results will be ready. Keep all follow-up visits. This is important. Summary An echocardiogram is a test that uses sound waves (ultrasound) to produce images of the heart. Images from an echocardiogram can provide important information about the size and shape of your heart, heart muscle function, heart valve function, and other possible heart problems. You do not need to do anything to prepare before this test. You may eat and drink normally. After the echocardiogram is completed, you may return to your normal, everyday life, unless your health care provider tells you not to do that. This information is not intended to replace advice given to you by your health care provider. Make sure you discuss any questions you have with your health care provider. Document Revised: 08/18/2019 Document Reviewed: 08/18/2019 Elsevier Patient Education  2021 Elsevier Inc.   Important Information About Sugar

## 2023-07-11 NOTE — Assessment & Plan Note (Signed)
 Palpitations infrequent rare episodes over the years, with more frequent episodes 3 weeks ago, lasting for couple days. Have subsided since then along with her GI symptoms.  Likely related to an acute gastrointestinal infection.  Given longstanding history of palpitations on and off we will proceed with Zio patch for 2 weeks.  Suggested options for home monitoring with devices such as Kardia mobile or smart watches.  Will also obtain structural and functional assessment of the heart with an echocardiogram, in light of her symptoms and family history of mitral valve prolapse.

## 2023-07-11 NOTE — Assessment & Plan Note (Signed)
 Her chest discomfort symptoms are atypical and appears more consistent with palpitations that were for 2 to 3 days around the time of her visit to the urgent care facility in the ER.  EKG findings are nonspecific for Q wave changes in lead II otherwise normal EKG, does not appear pathological.  She has good functional capacity.  However no regular exercise. Advised to increase her activity as tolerated and if notices any exertional symptoms to let us  know.  At this time given the nature of her symptoms I do not see need for ischemic workup.

## 2023-07-11 NOTE — Assessment & Plan Note (Signed)
 Evaded blood pressure readings at her recent urgent care and ER visit. Subsequent blood pressure measurements at home have been reportedly normal. Here in the clinic blood pressure diastolic elevated 90 mmHg.  Advised to monitor blood pressures at home regularly and see if there is a pattern with the days she uses the Adderall.

## 2023-07-16 ENCOUNTER — Telehealth: Payer: Self-pay

## 2023-07-16 NOTE — Telephone Encounter (Signed)
 Pt called in stating she was having irritation with heart monitor and it fell off. Please advise of her options.

## 2023-07-16 NOTE — Telephone Encounter (Signed)
 Spoke with pt. She stated that she began having irritation under the Zio patch and it eventually fell off. She had worn it 5 days. Advised to write in the book that she had irritation under the monitor and it fell off and send it back to the company. Pt verbalized understanding and had no further questions

## 2023-07-31 DIAGNOSIS — R002 Palpitations: Secondary | ICD-10-CM | POA: Diagnosis not present

## 2023-08-06 ENCOUNTER — Ambulatory Visit

## 2023-08-06 DIAGNOSIS — R079 Chest pain, unspecified: Secondary | ICD-10-CM | POA: Diagnosis not present

## 2023-08-07 LAB — ECHOCARDIOGRAM COMPLETE
Area-P 1/2: 4.6 cm2
S' Lateral: 2.7 cm

## 2023-08-15 ENCOUNTER — Other Ambulatory Visit: Payer: Self-pay | Admitting: Family Medicine

## 2023-08-15 DIAGNOSIS — Z1231 Encounter for screening mammogram for malignant neoplasm of breast: Secondary | ICD-10-CM

## 2023-08-16 ENCOUNTER — Ambulatory Visit: Payer: Self-pay

## 2023-08-16 DIAGNOSIS — R002 Palpitations: Secondary | ICD-10-CM | POA: Diagnosis not present

## 2023-08-16 NOTE — Telephone Encounter (Signed)
 LVM for pt to call and schedule 3 week fu with SRM/kbl 08/16/23

## 2023-08-22 ENCOUNTER — Telehealth: Payer: Self-pay

## 2023-08-22 NOTE — Telephone Encounter (Signed)
 Received the following message from Dr. Liborio below:  The results from your echocardiogram show normal pumping function and relaxation function of the heart. No significant valve abnormalities. Overall this is good news and reassuring. Please do not hesitate to contact my office with any questions.  Thank you  Attempted to call the patient to inform her of the results of her echo. Patient did not answer the phone and a message was left for her to call back.

## 2023-09-17 ENCOUNTER — Ambulatory Visit
Admission: RE | Admit: 2023-09-17 | Discharge: 2023-09-17 | Disposition: A | Discharge status: Home / Self Care | Source: Ambulatory Visit

## 2023-09-17 DIAGNOSIS — Z1231 Encounter for screening mammogram for malignant neoplasm of breast: Secondary | ICD-10-CM

## 2023-10-21 DIAGNOSIS — R21 Rash and other nonspecific skin eruption: Secondary | ICD-10-CM | POA: Diagnosis not present

## 2023-10-25 DIAGNOSIS — F909 Attention-deficit hyperactivity disorder, unspecified type: Secondary | ICD-10-CM | POA: Diagnosis not present

## 2023-11-07 DIAGNOSIS — D485 Neoplasm of uncertain behavior of skin: Secondary | ICD-10-CM | POA: Diagnosis not present

## 2023-11-07 DIAGNOSIS — L449 Papulosquamous disorder, unspecified: Secondary | ICD-10-CM | POA: Diagnosis not present

## 2023-11-07 DIAGNOSIS — L309 Dermatitis, unspecified: Secondary | ICD-10-CM | POA: Diagnosis not present

## 2023-11-07 DIAGNOSIS — L308 Other specified dermatitis: Secondary | ICD-10-CM | POA: Diagnosis not present

## 2023-11-21 DIAGNOSIS — L432 Lichenoid drug reaction: Secondary | ICD-10-CM | POA: Diagnosis not present

## 2023-11-25 ENCOUNTER — Encounter (HOSPITAL_BASED_OUTPATIENT_CLINIC_OR_DEPARTMENT_OTHER): Payer: Self-pay | Admitting: Certified Nurse Midwife

## 2023-11-25 ENCOUNTER — Other Ambulatory Visit (HOSPITAL_COMMUNITY)
Admission: RE | Admit: 2023-11-25 | Discharge: 2023-11-25 | Disposition: A | Discharge status: Home / Self Care | Source: Ambulatory Visit | Attending: Certified Nurse Midwife | Admitting: Certified Nurse Midwife

## 2023-11-25 ENCOUNTER — Ambulatory Visit (HOSPITAL_BASED_OUTPATIENT_CLINIC_OR_DEPARTMENT_OTHER): Admitting: Certified Nurse Midwife

## 2023-11-25 VITALS — BP 124/79 | HR 91 | Ht 69.0 in | Wt 255.2 lb

## 2023-11-25 DIAGNOSIS — Z124 Encounter for screening for malignant neoplasm of cervix: Secondary | ICD-10-CM | POA: Diagnosis not present

## 2023-11-25 DIAGNOSIS — Z6837 Body mass index (BMI) 37.0-37.9, adult: Secondary | ICD-10-CM

## 2023-11-25 DIAGNOSIS — N920 Excessive and frequent menstruation with regular cycle: Secondary | ICD-10-CM

## 2023-11-25 DIAGNOSIS — Z1331 Encounter for screening for depression: Secondary | ICD-10-CM

## 2023-11-25 DIAGNOSIS — Z01419 Encounter for gynecological examination (general) (routine) without abnormal findings: Secondary | ICD-10-CM | POA: Diagnosis not present

## 2023-11-25 NOTE — Progress Notes (Unsigned)
 Works for [employer] Periods ar emonthly but now heavy, sometimes changes pad every 2 hours Can no longer wear a disk due to heavy bleeding  Hx CS 10lb 14oz (Luke) Vasectomy  Pt did not tolerate COC well, caused 40lb weight gain in short amount of time  ADHD, starting Strattera   38 y.o. [id number] Married White or Caucasian female here for annual exam.  She works for [employer]. Reports her periods are monthly but heavy. She, at times, needs to change a Maxi-Pad every 2 hours. She work a menstrual cup in the past but can no longer wear that due to heavy menstrual periods. She has a Hx of Primary CS 10lb 14oz Luke.   Patient's last menstrual period was 11/14/2023.          Sexually active: Yes.    The current method of family planning is vasectomy.    Exercising: Yes.     Smoker:  no  Health Maintenance: Pap:  Collected History of abnormal Pap:  no MMG:  Annual screening mammogram encouraged.   reports that she has never smoked. She has never used smokeless tobacco. She reports that she does not currently use alcohol. She reports that she does not use drugs.  Past Medical History:  Diagnosis Date   Allergy    Anxiety    Postpartum care following cesarean delivery (5/24) 06/02/2015    Past Surgical History:  Procedure Laterality Date   CESAREAN SECTION N/A 06/01/2015   Procedure: PRIMARY CESAREAN SECTION;  Surgeon: Marie-Lyne Lavoie, MD;  [facility]: [facility];  Service: Obstetrics;  Laterality: N/A;   CHROMOPERTUBATION N/A 04/22/2014   Procedure: CHROMOPERTUBATION;  Surgeon: Marie-Lyne Lavoie, MD;  Location: [facility];  Service: Gynecology;  Laterality: N/A;  Fallopian Tubes   LAPAROSCOPIC APPENDECTOMY N/A 06/10/2015   Procedure: APPENDECTOMY LAPAROSCOPIC;  Surgeon: Krystal Spinner, MD;  Location: [facility];  Service: General;  Laterality: N/A;   NO PAST SURGERIES     ROBOTIC ASSISTED LAPAROSCOPIC OVARIAN CYSTECTOMY Left 04/22/2014   Procedure: ROBOTIC ASSISTED  LAPAROSCOPIC OVARIAN CYSTECTOMY;  Surgeon: Percilla Burly, MD;  Location: [facility];  Service: Gynecology;  Laterality: Left;    Current Outpatient Medications  Medication Sig Dispense Refill   acetaminophen  (TYLENOL ) 325 MG tablet Take 650-975 mg by mouth every 6 (six) hours as needed for mild pain or headache.     amphetamine-dextroamphetamine (ADDERALL XR) 15 MG 24 hr capsule TAKE 1 CAPSULE BY MOUTH IN THE MORNING ONCE DAILY FOR 30 DAYS     cetirizine (ZYRTEC) 10 MG tablet Take 10 mg by mouth daily.     ibuprofen  (ADVIL ,MOTRIN ) 800 MG tablet Take 1 tablet (800 mg total) by mouth every 6 (six) hours as needed for mild pain or cramping. 30 tablet 0   No current facility-administered medications for this visit.    Family History  Problem Relation Age of Onset   Depression Mother    Varicose Veins Mother    Cancer Father        pancreatic   Stroke Father     ROS: Constitutional: negative Genitourinary:negative  Exam:   BP 124/79   Pulse 91   Ht 5' 9 (1.753 m)   Wt 255 lb 3.2 oz (115.8 kg)   LMP 11/14/2023   BMI 37.69 kg/m   Height: 5' 9 (175.3 cm)  General appearance: alert, cooperative and appears stated age Head: Normocephalic, without obvious abnormality, atraumatic Lungs: clear to auscultation bilaterally Breasts: normal appearance, no masses or tenderness, Inspection negative, No nipple  retraction or dimpling, No nipple discharge or bleeding, No axillary or supraclavicular adenopathy, Normal to palpation without dominant masses Heart: regular rate and rhythm Abdomen: soft, non-tender; bowel sounds normal; no masses,  no organomegaly Extremities: extremities normal, atraumatic, no cyanosis or edema Skin: Skin color, texture, turgor normal. No rashes or lesions Lymph nodes: Cervical, supraclavicular, and axillary nodes normal. No abnormal inguinal nodes palpated Neurologic: Grossly normal   Pelvic: External genitalia:  no lesions              Urethra:  normal  appearing urethra with no masses, tenderness or lesions              Bartholins and Skenes: normal                 Vagina: normal appearing vagina with normal color and no discharge, no lesions              Cervix: no bleeding following Pap              Pap taken: Yes.   Bimanual Exam:  Uterus:  normal size, contour, position, consistency, mobility, non-tender              Adnexa: no mass, fullness, tenderness               Rectovaginal: Confirms               Anus:  normal sphincter tone, no lesions  Chaperone,  CMA, was present for exam.  Assessment/Plan: 1. Encounter for annual routine gynecological examination (Primary) - Routine pap smear collected for cervical cancer screening - Continue/start annual screening mammograms. Breast self awareness encouraged. - Contraception: Vasectomy   2. Menorrhagia with regular cycle - Thyroid Panel With TSH - B12 - CBC - Comp Met (CMET) - VITAMIN D 25 Hydroxy (Vit-D Deficiency, Fractures) - Reviewed options to attempt management of menorrhagia  3. BMI 37.0-37.9, adult - Thyroid Panel With TSH - Comp Met (CMET)  4. Cervical cancer screening - Cytology - PAP( [facility])   RTO 1 year for annual gyn exam and prn if issues arise. Arland MARLA Roller

## 2023-11-26 LAB — COMPREHENSIVE METABOLIC PANEL WITH GFR
ALT: 63 IU/L — ABNORMAL HIGH (ref 0–32)
AST: 38 IU/L (ref 0–40)
Albumin: 4.5 g/dL (ref 3.9–4.9)
Alkaline Phosphatase: 159 IU/L — ABNORMAL HIGH (ref 41–116)
BUN/Creatinine Ratio: 7 — ABNORMAL LOW (ref 9–23)
BUN: 6 mg/dL (ref 6–24)
Bilirubin Total: 0.5 mg/dL (ref 0.0–1.2)
CO2: 23 mmol/L (ref 20–29)
Calcium: 9.8 mg/dL (ref 8.7–10.2)
Chloride: 101 mmol/L (ref 96–106)
Creatinine, Ser: 0.88 mg/dL (ref 0.57–1.00)
Globulin, Total: 2.1 g/dL (ref 1.5–4.5)
Glucose: 86 mg/dL (ref 70–99)
Potassium: 4.1 mmol/L (ref 3.5–5.2)
Sodium: 139 mmol/L (ref 134–144)
Total Protein: 6.6 g/dL (ref 6.0–8.5)
eGFR: 85 mL/min/1.73 (ref >59)

## 2023-11-26 LAB — CBC
Hematocrit: 43.8 % (ref 34.0–46.6)
Hemoglobin: 14.5 g/dL (ref 11.1–15.9)
MCH: 30.3 pg (ref 26.6–33.0)
MCHC: 33.1 g/dL (ref 31.5–35.7)
MCV: 92 fL (ref 79–97)
NRBC: 5 % — ABNORMAL HIGH (ref 0–0)
Platelets: 253 x10E3/uL (ref 150–450)
RBC: 4.78 x10E6/uL (ref 3.77–5.28)
RDW: 12.3 % (ref 11.7–15.4)
WBC: 7.6 x10E3/uL (ref 3.4–10.8)

## 2023-11-26 LAB — THYROID PANEL WITH TSH
Free Thyroxine Index: 1.9 (ref 1.2–4.9)
T3 Uptake Ratio: 21 % — ABNORMAL LOW (ref 24–39)
T4, Total: 8.9 ug/dL (ref 4.5–12.0)
TSH: 3 u[IU]/mL (ref 0.450–4.500)

## 2023-11-26 LAB — VITAMIN D 25 HYDROXY (VIT D DEFICIENCY, FRACTURES): Vit D, 25-Hydroxy: 19.8 ng/mL — ABNORMAL LOW (ref 30.0–100.0)

## 2023-11-26 LAB — VITAMIN B12: Vitamin B-12: 356 pg/mL (ref 232–1245)

## 2023-11-28 ENCOUNTER — Ambulatory Visit (HOSPITAL_BASED_OUTPATIENT_CLINIC_OR_DEPARTMENT_OTHER): Payer: Self-pay | Admitting: Certified Nurse Midwife

## 2023-11-29 LAB — CYTOLOGY - PAP
Comment: NEGATIVE
Diagnosis: NEGATIVE
Diagnosis: REACTIVE
High risk HPV: NEGATIVE

## 2023-12-25 DIAGNOSIS — J069 Acute upper respiratory infection, unspecified: Secondary | ICD-10-CM | POA: Diagnosis not present
